# Patient Record
Sex: Female | Born: 1950 | Race: White | Hispanic: No | Marital: Married | Smoking: Never smoker
Health system: Southern US, Community
[De-identification: ages and names within clinical notes are randomized; demographics above are authoritative.]

## PROBLEM LIST (undated history)

## (undated) DIAGNOSIS — C50919 Malignant neoplasm of unspecified site of unspecified female breast: Secondary | ICD-10-CM

## (undated) DIAGNOSIS — R519 Headache, unspecified: Secondary | ICD-10-CM

## (undated) HISTORY — DX: Headache, unspecified: R51.9

## (undated) HISTORY — PX: BREAST LUMPECTOMY: SHX2

## (undated) HISTORY — DX: Malignant neoplasm of unspecified site of unspecified female breast: C50.919

## (undated) HISTORY — PX: MASTECTOMY: SHX3

---

## 1996-02-15 DIAGNOSIS — C50919 Malignant neoplasm of unspecified site of unspecified female breast: Secondary | ICD-10-CM

## 1996-02-15 HISTORY — DX: Malignant neoplasm of unspecified site of unspecified female breast: C50.919

## 2018-03-19 ENCOUNTER — Ambulatory Visit (INDEPENDENT_AMBULATORY_CARE_PROVIDER_SITE_OTHER): Payer: Medicare Other | Admitting: Pulmonary Disease

## 2018-03-19 ENCOUNTER — Encounter: Payer: Self-pay | Admitting: Pulmonary Disease

## 2018-03-19 DIAGNOSIS — R0602 Shortness of breath: Secondary | ICD-10-CM | POA: Diagnosis not present

## 2018-03-19 MED ORDER — PREDNISONE 10 MG PO TABS: 10.0000 mg | ORAL_TABLET | Freq: Two times a day (BID) | ORAL | 0 refills | Status: DC

## 2018-03-19 NOTE — Progress Notes (Signed)
Sonia Bell    [id number]    02-02-1951  Primary Care Physician:Beal, Earma Reading  Referring Physician: No referring provider defined for this encounter.  Chief complaint: Patient being seen for shortness of breath  HPI:  Past history of breast cancer for which she had chemoradiation She did develop pulmonary complications following chemoradiation requiring treatment This was in the 90s She recently has been complaining of some shortness of breath She has not felt acutely ill  She remembers from the last treatment that she had to have multiple PFTs and radiological studies and at the end of her treatment she was told that her lungs did clear up  Has noticed increased shortness of breath for the last couple of weeks There was no associated acute illness  Outpatient Encounter Medications as of 03/19/2018  Medication Sig  . calcium citrate (CALCITRATE - DOSED IN MG ELEMENTAL CALCIUM) 950 MG tablet Take by mouth.  . Coenzyme Q-10 100 MG capsule Take by mouth.  . levothyroxine (SYNTHROID) 75 MCG tablet TAKE 1 TABLET DAILY  . magnesium oxide (MAG-OX) 400 MG tablet Take by mouth.  . Omega-3 Fatty Acids (FISH OIL) 1000 MG CAPS Take by mouth.  . SUMAtriptan (IMITREX) 100 MG tablet TAKE 1 TABLET AT ONSET OF HEADACHE. MAY REPEAT IN 2 HOURS IF NEEDED.  Marland Kitchen predniSONE (DELTASONE) 10 MG tablet Take 1 tablet (10 mg total) by mouth 2 (two) times daily with a meal.   No facility-administered encounter medications on file as of 03/19/2018.     Allergies as of 03/19/2018 - Review Complete 03/19/2018  Allergen Reaction Noted  . Iodides Rash 03/19/2018  . Quinine derivatives  08/31/2011    Past Medical History:  Diagnosis Date  . Breast cancer ([place]) 02/15/1996   right side    No family history on file.  Social History   Socioeconomic History  . Marital status: Married    Spouse name: Not on file  . Number of children: Not on file  . Years of education: Not on file  .  Highest education level: Not on file  Occupational History  . Not on file  Social Needs  . Financial resource strain: Not on file  . Food insecurity:    Worry: Not on file    Inability: Not on file  . Transportation needs:    Medical: Not on file    Non-medical: Not on file  Tobacco Use  . Smoking status: Never Smoker  . Smokeless tobacco: Never Used  Substance and Sexual Activity  . Alcohol use: Not on file  . Drug use: Not on file  . Sexual activity: Not on file  Lifestyle  . Physical activity:    Days per week: Not on file    Minutes per session: Not on file  . Stress: Not on file  Relationships  . Social connections:    Talks on phone: Not on file    Gets together: Not on file    Attends religious service: Not on file    Active member of club or organization: Not on file    Attends meetings of clubs or organizations: Not on file    Relationship status: Not on file  . Intimate partner violence:    Fear of current or ex partner: Not on file    Emotionally abused: Not on file    Physically abused: Not on file    Forced sexual activity: Not on file  Other Topics Concern  .  Not on file  Social History Narrative  . Not on file    Review of Systems  Constitutional: Negative.   HENT: Negative.   Eyes: Negative.   Respiratory: Positive for shortness of breath.   Cardiovascular: Negative.   Gastrointestinal: Negative.   Endocrine: Negative.     Vitals:   03/19/18 1440  BP: 140/88  Pulse: 84  SpO2: 97%     Physical Exam  Constitutional: She appears well-developed and well-nourished.  HENT:  Head: Normocephalic.  Eyes: Pupils are equal, round, and reactive to light. Conjunctivae are normal. Right eye exhibits no discharge. Left eye exhibits no discharge.  Neck: Normal range of motion. Neck supple. No tracheal deviation present. No thyromegaly present.  Cardiovascular: Normal rate and regular rhythm.  Pulmonary/Chest: Effort normal and breath sounds normal.  No respiratory distress. She has no wheezes.  Abdominal: Soft. Bowel sounds are normal. She exhibits no distension. There is no abdominal tenderness.   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 twice daily of prednisone Obtain a CT scan of the chest without contrast Obtain a pulmonary function study  If no significant findings, will consider an echocardiogram to assess cardiac functions  Encouraged to call with any significant concerns   Sherrilyn Rist MD [facility] 03/19/2018, 3:04 PM  CC: No ref. provider found

## 2018-03-19 NOTE — Patient Instructions (Signed)
Shortness of breath History of breast cancer with chemoradiation-associated with pulmonary complications Symptoms likely related to recurrence of inflammation in the lungs  Short course of steroids Obtain a CT scan of the chest Obtain a breathing study  We will see you back in the office in about 4 weeks

## 2018-04-05 ENCOUNTER — Ambulatory Visit (INDEPENDENT_AMBULATORY_CARE_PROVIDER_SITE_OTHER)
Admission: RE | Admit: 2018-04-05 | Discharge: 2018-04-05 | Disposition: A | Discharge status: Home / Self Care | Payer: Medicare Other | Source: Ambulatory Visit | Attending: Pulmonary Disease | Admitting: Pulmonary Disease

## 2018-04-05 ENCOUNTER — Other Ambulatory Visit: Payer: Self-pay | Admitting: Pulmonary Disease

## 2018-04-05 ENCOUNTER — Telehealth: Payer: Self-pay | Admitting: Pulmonary Disease

## 2018-04-05 DIAGNOSIS — R0602 Shortness of breath: Secondary | ICD-10-CM | POA: Diagnosis not present

## 2018-04-05 MED ORDER — PREDNISONE 10 MG PO TABS: 10.0000 mg | ORAL_TABLET | Freq: Every day | ORAL | 0 refills | Status: DC

## 2018-04-05 NOTE — Telephone Encounter (Signed)
LVMTCB x 1 for patient. Need to confirm if the patient still has prednisone or needs refill issued.

## 2018-04-05 NOTE — Telephone Encounter (Signed)
Place her back on prednisone and wean slowly

## 2018-04-05 NOTE — Telephone Encounter (Signed)
Call made to patient, per patient Patient states as she came off of prednisone she has had severe fatigue with bodyaches,joint pain,nausea and vomiting,loss of appetite,bloody nose. Stopped prednisone on 01/24 and symptoms began 01/25. She declined appt stating she would come if a provider told her she had too.   OA please advise. Thanks.

## 2018-04-08 NOTE — Telephone Encounter (Signed)
Pt is aware of below message/recommendations and voiced her understanding. °Nothing further is needed.  °

## 2018-04-08 NOTE — Telephone Encounter (Signed)
Spoke to pt andf relayed below recommendations.  Pt states symptoms have improved but not completely subsided.  Pt reports of continuous weakness. Pt is questioning if she should resume the prednsione due to symptoms improving.   OA please advise. Thanks

## 2018-04-08 NOTE — Telephone Encounter (Signed)
If symptoms are improving, she does not have to restart prednisone Symptoms will take time to resolve   Withdrawal from prednisone can be associated with weakness

## 2018-04-16 ENCOUNTER — Encounter: Payer: Self-pay | Admitting: Pulmonary Disease

## 2018-04-16 ENCOUNTER — Ambulatory Visit (INDEPENDENT_AMBULATORY_CARE_PROVIDER_SITE_OTHER): Payer: Medicare Other | Admitting: Pulmonary Disease

## 2018-04-16 ENCOUNTER — Telehealth: Payer: Self-pay | Admitting: Pulmonary Disease

## 2018-04-16 DIAGNOSIS — R911 Solitary pulmonary nodule: Secondary | ICD-10-CM | POA: Diagnosis not present

## 2018-04-16 DIAGNOSIS — R0602 Shortness of breath: Secondary | ICD-10-CM | POA: Diagnosis not present

## 2018-04-16 LAB — CBC WITH DIFFERENTIAL/PLATELET
Basophils Absolute: 0 10*3/uL (ref 0.0–0.1)
Basophils Relative: 0.7 % (ref 0.0–3.0)
Eosinophils Absolute: 0.1 10*3/uL (ref 0.0–0.7)
Eosinophils Relative: 0.9 % (ref 0.0–5.0)
HCT: 40.2 % (ref 36.0–46.0)
Hemoglobin: 13.5 g/dL (ref 12.0–15.0)
Lymphocytes Relative: 20.3 % (ref 12.0–46.0)
Lymphs Abs: 1.2 10*3/uL (ref 0.7–4.0)
MCHC: 33.7 g/dL (ref 30.0–36.0)
MCV: 93.9 fl (ref 78.0–100.0)
MONOS PCT: 5.7 % (ref 3.0–12.0)
Monocytes Absolute: 0.3 10*3/uL (ref 0.1–1.0)
Neutro Abs: 4.4 10*3/uL (ref 1.4–7.7)
Neutrophils Relative %: 72.4 % (ref 43.0–77.0)
Platelets: 363 10*3/uL (ref 150.0–400.0)
RBC: 4.28 Mil/uL (ref 3.87–5.11)
RDW: 14 % (ref 11.5–15.5)
WBC: 6.1 10*3/uL (ref 4.0–10.5)

## 2018-04-16 LAB — PULMONARY FUNCTION TEST
DL/VA % pred: 75 %
DL/VA: 3.06 ml/min/mmHg/L
DLCO unc % pred: 67 %
DLCO unc: 14.82 ml/min/mmHg
FEF 25-75 Post: 2.63 L/sec
FEF 25-75 Pre: 1.99 L/sec
FEF2575-%Change-Post: 32 %
FEF2575-%PRED-POST: 116 %
FEF2575-%Pred-Pre: 88 %
FEV1-%CHANGE-POST: 6 %
FEV1-%Pred-Post: 91 %
FEV1-%Pred-Pre: 85 %
FEV1-Post: 2.47 L
FEV1-Pre: 2.31 L
FEV1FVC-%Change-Post: 7 %
FEV1FVC-%Pred-Pre: 101 %
FEV6-%Change-Post: 0 %
FEV6-%Pred-Post: 86 %
FEV6-%Pred-Pre: 86 %
FEV6-Post: 2.94 L
FEV6-Pre: 2.94 L
FEV6FVC-%Change-Post: 0 %
FEV6FVC-%Pred-Post: 104 %
FEV6FVC-%Pred-Pre: 103 %
FVC-%Change-Post: 0 %
FVC-%PRED-POST: 83 %
FVC-%Pred-Pre: 83 %
FVC-Post: 2.94 L
FVC-Pre: 2.96 L
POST FEV1/FVC RATIO: 84 %
PRE FEV6/FVC RATIO: 99 %
Post FEV6/FVC ratio: 100 %
Pre FEV1/FVC ratio: 78 %
RV % pred: 97 %
RV: 2.24 L
TLC % pred: 95 %
TLC: 5.32 L

## 2018-04-16 LAB — BASIC METABOLIC PANEL
BUN: 23 mg/dL (ref 6–23)
CO2: 30 mEq/L (ref 19–32)
Calcium: 9.5 mg/dL (ref 8.4–10.5)
Chloride: 100 mEq/L (ref 96–112)
Creatinine, Ser: 0.93 mg/dL (ref 0.40–1.20)
GFR: 60.06 mL/min (ref >60.00)
GLUCOSE: 99 mg/dL (ref 70–99)
Potassium: 3.8 mEq/L (ref 3.5–5.1)
SODIUM: 139 meq/L (ref 135–145)

## 2018-04-16 LAB — SEDIMENTATION RATE: Sed Rate: 10 mm/hr (ref 0–30)

## 2018-04-16 MED ORDER — ALBUTEROL SULFATE HFA 108 (90 BASE) MCG/ACT IN AERS: 2.0000 | INHALATION_SPRAY | Freq: Four times a day (QID) | RESPIRATORY_TRACT | 6 refills | Status: DC | PRN

## 2018-04-16 NOTE — Patient Instructions (Signed)
Repeat CT scan of the chest in 3 months to follow-up on the lung nodule on the right lung  Prescription for albuterol to be used 2 puffs up to 4 times daily as needed  Obtain CBC, BMP, ESR  I will see you back in 3 months-appointment should be after you have had the CT 

## 2018-04-16 NOTE — Progress Notes (Signed)
PFT done today. 

## 2018-04-16 NOTE — Addendum Note (Signed)
Addended by: Suzzanne Cloud E on: 04/16/2018 10:12 AM   Modules accepted: Orders

## 2018-04-16 NOTE — Telephone Encounter (Signed)
Laurin Coder, MD  Madolyn Frieze, LPN        Call patient   8 mm lung nodule  Requires follow-up in 3 months to assess stability  No other significant abnormality noted on CT   Unclear whether it has been there for a while or its new--we will follow-up to make sure it is stable and if it shows any signs of instability then may need other intervention    Called and spoke with pt letting her know the results of CT and stated to her that we will repeat CT in 3 months. Pt expressed understanding. Order has been placed for CT to be done in 3 months.nothing further needed.

## 2018-04-16 NOTE — Progress Notes (Signed)
Sonia Bell    [id number]    Jun 29, 1950  Primary Care Physician:Beal, Earma Reading  Referring Physician: Nicholes Rough, PA-C [address] [place], [place] [address]  Chief complaint: Patient being seen for shortness of breath Fatigue since completing course of prednisone  HPI: She does have fatigue Denies any acute illness  She is not coughing She does get short of breath with activity  She completed course of low-dose prednisone  Past history of breast cancer for which she had chemoradiation She did develop pulmonary complications following chemoradiation requiring treatment This was in the 90s She recently has been complaining of some shortness of breath  She remembers from the last treatment that she had to have multiple PFTs and radiological studies and at the end of her treatment she was told that her lungs did clear up  Has noticed increased shortness of breath for the last couple of weeks There was no associated acute illness  Outpatient Encounter Medications as of 04/16/2018  Medication Sig  . calcium citrate (CALCITRATE - DOSED IN MG ELEMENTAL CALCIUM) 950 MG tablet Take by mouth.  . Coenzyme Q-10 100 MG capsule Take by mouth.  . levothyroxine (SYNTHROID) 75 MCG tablet TAKE 1 TABLET DAILY  . magnesium oxide (MAG-OX) 400 MG tablet Take by mouth.  . Omega-3 Fatty Acids (FISH OIL) 1000 MG CAPS Take by mouth.  . predniSONE (DELTASONE) 10 MG tablet Take 1 tablet (10 mg total) by mouth 2 (two) times daily with a meal.  . predniSONE (DELTASONE) 10 MG tablet Take 1 tablet (10 mg total) by mouth daily with breakfast. 10 mg daily for 5 days then 5 mg daily for 5 days then 5 mg on alternate days for 5 doses  . SUMAtriptan (IMITREX) 100 MG tablet TAKE 1 TABLET AT ONSET OF HEADACHE. MAY REPEAT IN 2 HOURS IF NEEDED.  Marland Kitchen albuterol (PROVENTIL HFA;VENTOLIN HFA) 108 (90 Base) MCG/ACT inhaler Inhale 2 puffs into the lungs every 6 (six) hours as needed for wheezing or  shortness of breath.   No facility-administered encounter medications on file as of 04/16/2018.     Allergies as of 04/16/2018 - Review Complete 04/16/2018  Allergen Reaction Noted  . Iodides Nausea And Vomiting 03/19/2018  . Quinine derivatives  08/31/2011    Past Medical History:  Diagnosis Date  . Breast cancer ([place]) 02/15/1996   right side    No family history on file.  Social History   Socioeconomic History  . Marital status: Married    Spouse name: Not on file  . Number of children: Not on file  . Years of education: Not on file  . Highest education level: Not on file  Occupational History  . Not on file  Social Needs  . Financial resource strain: Not on file  . Food insecurity:    Worry: Not on file    Inability: Not on file  . Transportation needs:    Medical: Not on file    Non-medical: Not on file  Tobacco Use  . Smoking status: Never Smoker  . Smokeless tobacco: Never Used  Substance and Sexual Activity  . Alcohol use: Not on file  . Drug use: Not on file  . Sexual activity: Not on file  Lifestyle  . Physical activity:    Days per week: Not on file    Minutes per session: Not on file  . Stress: Not on file  Relationships  . Social connections:  Talks on phone: Not on file    Gets together: Not on file    Attends religious service: Not on file    Active member of club or organization: Not on file    Attends meetings of clubs or organizations: Not on file    Relationship status: Not on file  . Intimate partner violence:    Fear of current or ex partner: Not on file    Emotionally abused: Not on file    Physically abused: Not on file    Forced sexual activity: Not on file  Other Topics Concern  . Not on file  Social History Narrative  . Not on file    Review of Systems  Constitutional: Positive for fatigue.  HENT: Negative.   Eyes: Negative.   Respiratory: Positive for shortness of breath.   Cardiovascular: Negative.     Gastrointestinal: Negative.   Endocrine: Negative.     Vitals:   04/16/18 0950  BP: 132/80  Pulse: (!) 102  SpO2: 98%     Physical Exam  Constitutional: She appears well-developed and well-nourished.  HENT:  Head: Normocephalic.  Eyes: Pupils are equal, round, and reactive to light. Conjunctivae are normal. Right eye exhibits no discharge. Left eye exhibits no discharge.  Neck: Normal range of motion. Neck supple. No tracheal deviation present. No thyromegaly present.  Cardiovascular: Normal rate and regular rhythm.  Pulmonary/Chest: Effort normal and breath sounds normal. No respiratory distress. She has no wheezes. She has no rales. She exhibits no tenderness.  Abdominal: Soft. Bowel sounds are normal. She exhibits no distension. There is no abdominal tenderness.   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

## 2018-07-09 ENCOUNTER — Telehealth: Payer: Self-pay | Admitting: *Deleted

## 2018-07-09 NOTE — Telephone Encounter (Signed)
Covid-19 travel screening questions  Have you traveled in the last 14 days? If yes where? No Do you now or have you had a fever in the last 14 days? No Do you have any respiratory symptoms of shortness of breath or cough now or in the last 14 days? No Do you have any family members or close contacts with diagnosed or suspected Covid-19? No      

## 2018-07-10 ENCOUNTER — Other Ambulatory Visit: Payer: Self-pay

## 2018-07-10 ENCOUNTER — Ambulatory Visit (INDEPENDENT_AMBULATORY_CARE_PROVIDER_SITE_OTHER)
Admission: RE | Admit: 2018-07-10 | Discharge: 2018-07-10 | Disposition: A | Discharge status: Home / Self Care | Payer: Medicare Other | Source: Ambulatory Visit | Attending: Pulmonary Disease | Admitting: Pulmonary Disease

## 2018-07-10 DIAGNOSIS — R911 Solitary pulmonary nodule: Secondary | ICD-10-CM

## 2018-07-17 ENCOUNTER — Ambulatory Visit: Payer: Medicare Other | Admitting: Pulmonary Disease

## 2018-07-17 ENCOUNTER — Encounter: Payer: Self-pay | Admitting: Adult Health

## 2018-07-17 ENCOUNTER — Ambulatory Visit (INDEPENDENT_AMBULATORY_CARE_PROVIDER_SITE_OTHER): Payer: Medicare Other | Admitting: Adult Health

## 2018-07-17 ENCOUNTER — Other Ambulatory Visit: Payer: Self-pay

## 2018-07-17 DIAGNOSIS — J45909 Unspecified asthma, uncomplicated: Secondary | ICD-10-CM | POA: Insufficient documentation

## 2018-07-17 DIAGNOSIS — R911 Solitary pulmonary nodule: Secondary | ICD-10-CM | POA: Diagnosis not present

## 2018-07-17 DIAGNOSIS — J4520 Mild intermittent asthma, uncomplicated: Secondary | ICD-10-CM | POA: Diagnosis not present

## 2018-07-17 DIAGNOSIS — R918 Other nonspecific abnormal finding of lung field: Secondary | ICD-10-CM | POA: Diagnosis not present

## 2018-07-17 NOTE — Assessment & Plan Note (Signed)
Mild intermittent asthma improved symptom control with with as needed albuterol.  Plan  Patient Instructions  Plan on CT chest without contrast in 9 months to follow lung nodule ( 1mm) .  Albuterol inhaler As needed shortness of breath or wheezing  Follow up with Dr. Ander Slade in 9 months after CT chest and As needed

## 2018-07-17 NOTE — Progress Notes (Signed)
@Patient  ID: Sonia Bell, female    DOB: 1950/11/16, 68 y.o.   MRN: [id number]  Chief Complaint  Patient presents with   Follow-up    Lung nodule    Referring provider: Randa Evens  HPI: 68 year old female never smoker seen for pulmonary consult January 2020 for shortness of breath and lung nodule  TEST/EVENTS :  PFT reviewed showing hyperactivity in the small airways No major obstruction  CT scan of the chest shows a nodule in the right upper lobe which was not described on CT from 2013 -Reviewed with the patient  07/17/2018 Follow up : Lung nodule and dyspnea Patient returns for a 86-month follow-up.  Patient was seen earlier this year for shortness of breath and lung nodule.  Patient has a history of breast cancer status post mastectomy and radiation therapy.  A CT chest was done on April 05, 2018 that showed 8 mm semisolid nodular opacity in the right upper lobe.  Calcified granuloma in the right upper lobe, biapical scarring right greater than left.  Patient had a repeat CT chest on Jul 10, 2018 that showed a stable 0.7 cm pulmonary nodule in the right upper lobe.  Nodule appeared thin and linear consistent with a benign process.  Scattered calcified pulmonary nodules bilaterally.  Tiny 3 mm pulmonary nodule in the left lower lobe.  No mediastinal adenopathy. We reviewed her test results in detail.  We discussed a repeat CT chest in 9 months.  Patient had had initial shortness of breath . PFT showed no airflow obstruction or restriction.  Positive reversibility.. She was started on Albuterol . Says breathing is doing much better.  Says that she is very active.  She uses her albuterol on occasion.  Does not use it on an every day basis.  Says that she has no shortness of breath, no wheezing or cough.   Allergies  Allergen Reactions   Quinine Derivatives     Immunization History  Administered Date(s) Administered   Influenza Split 03/16/2011   Influenza Whole  10/17/2017   Influenza, Seasonal, Injecte, Preservative Fre 12/12/2016   Pneumococcal Conjugate-13 09/28/2017   Tdap 04/03/2006    Past Medical History:  Diagnosis Date   Breast cancer ([place]) 02/15/1996   right side    Tobacco History: Social History   Tobacco Use  Smoking Status Never Smoker  Smokeless Tobacco Never Used   Counseling given: Not Answered   Outpatient Medications Prior to Visit  Medication Sig Dispense Refill   albuterol (PROVENTIL HFA;VENTOLIN HFA) 108 (90 Base) MCG/ACT inhaler Inhale 2 puffs into the lungs every 6 (six) hours as needed for wheezing or shortness of breath. 1 Inhaler 6   calcium citrate (CALCITRATE - DOSED IN MG ELEMENTAL CALCIUM) 950 MG tablet Take by mouth.     Coenzyme Q-10 100 MG capsule Take by mouth.     levothyroxine (SYNTHROID) 75 MCG tablet TAKE 1 TABLET DAILY     magnesium oxide (MAG-OX) 400 MG tablet Take by mouth.     Omega-3 Fatty Acids (FISH OIL) 1000 MG CAPS Take by mouth.     SUMAtriptan (IMITREX) 100 MG tablet TAKE 1 TABLET AT ONSET OF HEADACHE. MAY REPEAT IN 2 HOURS IF NEEDED.     predniSONE (DELTASONE) 10 MG tablet Take 1 tablet (10 mg total) by mouth 2 (two) times daily with a meal. 20 tablet 0   predniSONE (DELTASONE) 10 MG tablet Take 1 tablet (10 mg total) by mouth daily with breakfast. 10 mg daily for  5 days then 5 mg daily for 5 days then 5 mg on alternate days for 5 doses 30 tablet 0   No facility-administered medications prior to visit.      Review of Systems:   Constitutional:   No  weight loss, night sweats,  Fevers, chills, fatigue, or  lassitude.  HEENT:   No headaches,  Difficulty swallowing,  Tooth/dental problems, or  Sore throat,                No sneezing, itching, ear ache, nasal congestion, post nasal drip,   CV:  No chest pain,  Orthopnea, PND, swelling in lower extremities, anasarca, dizziness, palpitations, syncope.   GI  No heartburn, indigestion, abdominal pain, nausea, vomiting,  diarrhea, change in bowel habits, loss of appetite, bloody stools.   Resp: No shortness of breath with exertion or at rest.  No excess mucus, no productive cough,  No non-productive cough,  No coughing up of blood.  No change in color of mucus.  No wheezing.  No chest wall deformity  Skin: no rash or lesions.  GU: no dysuria, change in color of urine, no urgency or frequency.  No flank pain, no hematuria   MS:  No joint pain or swelling.  No decreased range of motion.  No back pain.    Physical Exam  BP 118/80 (BP Location: Left Arm, Cuff Size: Normal)    Pulse 91    Temp 98.2 F (36.8 C)    Ht 5' 6.5" (1.689 m)    Wt 137 lb 6.4 oz (62.3 kg)    SpO2 100%    BMI 21.84 kg/m   GEN: A/Ox3; pleasant , NAD, well nourished    HEENT:  [place]/AT,  NOSE-clear, THROAT-clear, no lesions, no postnasal drip or exudate noted.   NECK:  Supple w/ fair ROM; no JVD; normal carotid impulses w/o bruits; no thyromegaly or nodules palpated; no lymphadenopathy.    RESP  Clear  P & A; w/o, wheezes/ rales/ or rhonchi. no accessory muscle use, no dullness to percussion  CARD:  RRR, no m/r/g, no peripheral edema, pulses intact, no cyanosis or clubbing.  GI:   Soft & nt; nml bowel sounds; no organomegaly or masses detected.   Musco: Warm bil, no deformities or joint swelling noted.   Neuro: alert, no focal deficits noted.    Skin: Warm, no lesions or rashes    Lab Results:  CBC    Component Value Date/Time   WBC 6.1 04/16/2018 1013   RBC 4.28 04/16/2018 1013   HGB 13.5 04/16/2018 1013   HCT 40.2 04/16/2018 1013   PLT 363.0 04/16/2018 1013   MCV 93.9 04/16/2018 1013   MCHC 33.7 04/16/2018 1013   RDW 14.0 04/16/2018 1013   LYMPHSABS 1.2 04/16/2018 1013   MONOABS 0.3 04/16/2018 1013   EOSABS 0.1 04/16/2018 1013   BASOSABS 0.0 04/16/2018 1013    BMET    Component Value Date/Time   NA 139 04/16/2018 1013   K 3.8 04/16/2018 1013   CL 100 04/16/2018 1013   CO2 30 04/16/2018 1013   GLUCOSE 99  04/16/2018 1013   BUN 23 04/16/2018 1013   CREATININE 0.93 04/16/2018 1013   CALCIUM 9.5 04/16/2018 1013    BNP No results found for: BNP  ProBNP No results found for: PROBNP  Imaging: Ct Chest Wo Contrast  Result Date: 07/10/2018 CLINICAL DATA:  Lung nodule greater than 1 cm. EXAM: CT CHEST WITHOUT CONTRAST TECHNIQUE: Multidetector CT imaging of the  chest was performed following the standard protocol without IV contrast. COMPARISON:  CT dated 04/05/2018 FINDINGS: Cardiovascular: No significant vascular findings. Normal heart size. No pericardial effusion. Mild atherosclerotic changes are noted of the thoracic aorta Mediastinum/Nodes: No enlarged mediastinal or axillary lymph nodes. Thyroid gland, trachea, and esophagus demonstrate no significant findings. Lungs/Pleura: There is a 0.7 cm pulmonary nodule in the medial right upper lobe axial series 3, image 36). This nodule is thin and linear on the coronal series. There is an area of pleuroparenchymal scarring at the right lung apex measuring approximately 2.8 by 1.2 cm. There are scattered calcified pulmonary nodules bilaterally. There is a tiny 3 mm pulmonary nodule at the left lower lobe (axial series 3, image 105). There is no pneumothorax. No large pleural effusion. There is some mild scarring at the right lower lobe. Upper Abdomen: No acute abnormality. Musculoskeletal: The patient is status post right-sided mastectomy with placement of a right-sided breast implant. IMPRESSION: 1. Stable 0.7 cm pulmonary nodule in the right upper lobe. This nodule appears thin and linear on the coronal series and is favored to represent a benign process. However, continued follow-up is recommended. Non-contrast chest CT at 6-12 months is recommended. If the nodule is stable at time of repeat CT, then future CT at 18-24 months (from today's scan) is considered optional for low-risk patients, but is recommended for high-risk patients. This recommendation follows  the consensus statement: Guidelines for Management of Incidental Pulmonary Nodules Detected on CT Images: From the Fleischner Society 2017; Radiology 2017; 284:228-243. 2. No acute intrathoracic abnormality detected. Aortic Atherosclerosis (ICD10-I70.0). Electronically Signed   By: Constance Holster M.D.   On: 07/10/2018 14:30      PFT Results Latest Ref Rng & Units 04/16/2018  FVC-Pre L 2.96  FVC-Predicted Pre % 83  FVC-Post L 2.94  FVC-Predicted Post % 83  Pre FEV1/FVC % % 78  Post FEV1/FCV % % 84  FEV1-Pre L 2.31  FEV1-Predicted Pre % 85  FEV1-Post L 2.47  DLCO UNC% % 67  DLCO COR %Predicted % 75  TLC L 5.32  TLC % Predicted % 95  RV % Predicted % 97    No results found for: NITRICOXIDE      Assessment & Plan:   Lung nodule Stable right upper lobe lung nodule.  Will set up follow-up CT chest without contrast in 9 months to follow this serially.  Asthma Mild intermittent asthma improved symptom control with with as needed albuterol.  Plan  Patient Instructions  Plan on CT chest without contrast in 9 months to follow lung nodule ( 42mm) .  Albuterol inhaler As needed shortness of breath or wheezing  Follow up with Dr. Ander Slade in 9 months after CT chest and As needed           Rexene Edison, NP 07/17/2018

## 2018-07-17 NOTE — Assessment & Plan Note (Signed)
Stable right upper lobe lung nodule.  Will set up follow-up CT chest without contrast in 9 months to follow this serially.

## 2018-07-17 NOTE — Patient Instructions (Addendum)
Plan on CT chest without contrast in 9 months to follow lung nodule ( 35mm) .  Albuterol inhaler As needed shortness of breath or wheezing  Follow up with Dr. Ander Slade in 9 months after CT chest and As needed

## 2019-03-24 NOTE — Telephone Encounter (Signed)
mychart message sent by pt verifying when her next appt was. I called pt and have scheduled her a f/u with Dr. Jenetta Downer 2/17 at 9am. In last OV with TP, she stated for pt to have a f/u 9 months after CT scan. Pt does not have a CT scheduled yet.  Stated to pt that I would send this to [facility] so they can get the CT appt scheduled based on when pt's OV is and she verbalized understanding. Routing to [facility].

## 2019-04-21 ENCOUNTER — Ambulatory Visit
Admission: RE | Admit: 2019-04-21 | Discharge: 2019-04-21 | Disposition: A | Discharge status: Home / Self Care | Payer: Medicare Other | Source: Ambulatory Visit | Attending: Adult Health | Admitting: Adult Health

## 2019-04-21 DIAGNOSIS — R918 Other nonspecific abnormal finding of lung field: Secondary | ICD-10-CM

## 2019-04-23 ENCOUNTER — Telehealth: Payer: Self-pay

## 2019-04-23 ENCOUNTER — Ambulatory Visit: Payer: Medicare Other | Admitting: Pulmonary Disease

## 2019-04-23 DIAGNOSIS — R911 Solitary pulmonary nodule: Secondary | ICD-10-CM

## 2019-04-23 NOTE — Telephone Encounter (Signed)
-----   Message from Laurin Coder, MD sent at 04/23/2019  9:33 AM EST ----- CT scan reviewed showing stable lung nodules  Recommendation is to follow-up in 12 months  Schedule CT scan without contrast for 12 months

## 2019-04-23 NOTE — Telephone Encounter (Signed)
I called and spoke with the patient and made her aware of her results. She verbalized understanding. I have placed an order for CT in  A year.

## 2019-04-28 ENCOUNTER — Encounter: Payer: Self-pay | Admitting: Pulmonary Disease

## 2019-04-28 ENCOUNTER — Ambulatory Visit (INDEPENDENT_AMBULATORY_CARE_PROVIDER_SITE_OTHER): Payer: Medicare Other | Admitting: Pulmonary Disease

## 2019-04-28 ENCOUNTER — Other Ambulatory Visit: Payer: Self-pay

## 2019-04-28 VITALS — BP 152/78 | HR 94 | Temp 97.5°F | Ht 66.5 in | Wt 138.0 lb

## 2019-04-28 DIAGNOSIS — R911 Solitary pulmonary nodule: Secondary | ICD-10-CM | POA: Diagnosis not present

## 2019-04-28 NOTE — Progress Notes (Signed)
Sonia Bell    [id number]    15-Jun-1950  Primary Care Physician:Beal, Earma Reading  Referring Physician: Nicholes Rough, PA-C [address] [place],  [place] [address]  Chief complaint: Patient being seen for shortness of breath Lung nodules  HPI: She still has some shortness of breath Albuterol helps  Albuterol does make her tired  CT scan recently showed stable lung nodules 8 mm and 3 mm  She is not coughing She does get short of breath with activity  Past history of breast cancer for which she had chemoradiation She did develop pulmonary complications following chemoradiation requiring treatment This was in the 90s She recently has been complaining of some shortness of breath  She remembers from the last treatment that she had to have multiple PFTs and radiological studies and at the end of her treatment she was told that her lungs did clear up  Has noticed increased shortness of breath for the last couple of weeks There was no associated acute illness  Outpatient Encounter Medications as of 04/28/2019  Medication Sig  . albuterol (PROVENTIL HFA;VENTOLIN HFA) 108 (90 Base) MCG/ACT inhaler Inhale 2 puffs into the lungs every 6 (six) hours as needed for wheezing or shortness of breath.  . calcium citrate (CALCITRATE - DOSED IN MG ELEMENTAL CALCIUM) 950 MG tablet Take by mouth.  . Coenzyme Q-10 100 MG capsule Take by mouth.  . levothyroxine (SYNTHROID) 75 MCG tablet TAKE 1 TABLET DAILY  . magnesium oxide (MAG-OX) 400 MG tablet Take by mouth.  . Omega-3 Fatty Acids (FISH OIL) 1000 MG CAPS Take by mouth.  . SUMAtriptan (IMITREX) 100 MG tablet TAKE 1 TABLET AT ONSET OF HEADACHE. MAY REPEAT IN 2 HOURS IF NEEDED.   No facility-administered encounter medications on file as of 04/28/2019.    Allergies as of 04/28/2019 - Review Complete 04/28/2019  Allergen Reaction Noted  . Quinine derivatives  08/31/2011    Past Medical History:  Diagnosis Date  . Breast cancer  ([place]) 02/15/1996   right side    History reviewed. No pertinent family history.  Social History   Socioeconomic History  . Marital status: Married    Spouse name: Not on file  . Number of children: Not on file  . Years of education: Not on file  . Highest education level: Not on file  Occupational History  . Not on file  Tobacco Use  . Smoking status: Never Smoker  . Smokeless tobacco: Never Used  Substance and Sexual Activity  . Alcohol use: Not on file  . Drug use: Not on file  . Sexual activity: Not on file  Other Topics Concern  . Not on file  Social History Narrative  . Not on file   Social Determinants of Health   Financial Resource Strain:   . Difficulty of Paying Living Expenses: Not on file  Food Insecurity:   . Worried About Charity fundraiser in the Last Year: Not on file  . Ran Out of Food in the Last Year: Not on file  Transportation Needs:   . Lack of Transportation (Medical): Not on file  . Lack of Transportation (Non-Medical): Not on file  Physical Activity:   . Days of Exercise per Week: Not on file  . Minutes of Exercise per Session: Not on file  Stress:   . Feeling of Stress : Not on file  Social Connections:   . Frequency of Communication with Friends and Family: Not on  file  . Frequency of Social Gatherings with Friends and Family: Not on file  . Attends Religious Services: Not on file  . Active Member of Clubs or Organizations: Not on file  . Attends Archivist Meetings: Not on file  . Marital Status: Not on file  Intimate Partner Violence:   . Fear of Current or Ex-Partner: Not on file  . Emotionally Abused: Not on file  . Physically Abused: Not on file  . Sexually Abused: Not on file    Review of Systems  Constitutional: Positive for fatigue.  HENT: Negative.   Eyes: Negative.   Respiratory: Positive for shortness of breath.   Cardiovascular: Negative.   Gastrointestinal: Negative.   Endocrine: Negative.     Vitals:    04/28/19 1204  BP: (!) 152/78  Pulse: 94  Temp: (!) 97.5 F (36.4 C)  SpO2: 96%   Physical Exam  Constitutional: She appears well-developed and well-nourished.  HENT:  Head: Normocephalic.  Eyes: Pupils are equal, round, and reactive to light. Conjunctivae are normal. Right eye exhibits no discharge. Left eye exhibits no discharge.  Neck: No tracheal deviation present. No thyromegaly present.  Cardiovascular: Normal rate and regular rhythm.  Pulmonary/Chest: Effort normal and breath sounds normal. No respiratory distress. She has no wheezes. She has no rales. She exhibits no tenderness.  Abdominal: Soft. Bowel sounds are normal. She exhibits no distension. There is no abdominal tenderness.  Musculoskeletal:     Cervical back: Normal range of motion and neck supple.   Studies:  PFT reviewed showing hyperactivity in the small airways No major obstruction  CT scan of the chest shows 2 lung nodules, compared with previous and stable  Assessment:  Shortness of breath -Continues to benefit from albuterol -Continue albuterol as needed  Fatigue -May be multifactorial  Abnormal CT showing an 8 mm right upper lobe , 3 mm lung nodule  Plan/Recommendations:  Repeat CT in 1 year  I will see her back in the office in 1 year after her next CAT scan  Encouraged to call with any significant concerns Continue using albuterol as needed     Sherrilyn Rist MD  Pulmonary and Critical Care 04/28/2019, 12:23 PM  CC: Nicholes Rough, PA-C

## 2019-04-28 NOTE — Patient Instructions (Signed)
Lung nodules Stable over the last year  I will see you in about a year from now We will repeat CT in a year  If nodules are stable at the time then we can stop following

## 2019-06-09 MED ORDER — ALBUTEROL SULFATE HFA 108 (90 BASE) MCG/ACT IN AERS: 2.0000 | INHALATION_SPRAY | Freq: Four times a day (QID) | RESPIRATORY_TRACT | 2 refills | Status: DC | PRN

## 2019-06-09 NOTE — Telephone Encounter (Signed)
This matter was addressed already in a previous message, will close message.

## 2020-03-25 ENCOUNTER — Ambulatory Visit (HOSPITAL_COMMUNITY): Payer: Medicare Other

## 2020-04-26 ENCOUNTER — Ambulatory Visit (HOSPITAL_COMMUNITY)
Admission: RE | Admit: 2020-04-26 | Discharge: 2020-04-26 | Disposition: A | Discharge status: Home / Self Care | Payer: Medicare Other | Source: Ambulatory Visit | Attending: Pulmonary Disease | Admitting: Pulmonary Disease

## 2020-04-26 ENCOUNTER — Encounter (HOSPITAL_COMMUNITY): Payer: Self-pay

## 2020-04-26 ENCOUNTER — Other Ambulatory Visit: Payer: Self-pay

## 2020-04-26 DIAGNOSIS — R911 Solitary pulmonary nodule: Secondary | ICD-10-CM | POA: Diagnosis not present

## 2020-04-27 ENCOUNTER — Ambulatory Visit (INDEPENDENT_AMBULATORY_CARE_PROVIDER_SITE_OTHER): Payer: Medicare Other | Admitting: Pulmonary Disease

## 2020-04-27 ENCOUNTER — Encounter: Payer: Self-pay | Admitting: Pulmonary Disease

## 2020-04-27 VITALS — BP 142/86 | HR 91 | Temp 97.7°F | Ht 66.5 in | Wt 136.6 lb

## 2020-04-27 DIAGNOSIS — R911 Solitary pulmonary nodule: Secondary | ICD-10-CM | POA: Diagnosis not present

## 2020-04-27 DIAGNOSIS — R9389 Abnormal findings on diagnostic imaging of other specified body structures: Secondary | ICD-10-CM | POA: Diagnosis not present

## 2020-04-27 NOTE — Patient Instructions (Signed)
Repeat CT scan of the chest in 1 year  Follow-up just after the CT is  Performed  Call with any significant concerns

## 2020-04-27 NOTE — Progress Notes (Signed)
Sonia Bell    [id number]    Feb 06, 1951  Primary Care Physician:Beal, Earma Reading  Referring Physician: Nicholes Rough, PA-C [address] [place],  [place] [address]  Chief complaint: Follow-up for lung nodules, shortness of breath with exertion  HPI: No significant changes in her health  She did have Covid in January 2020 and has not really bounced back since then  Albuterol does help shortness of breath  She is not coughing She does get short of breath with activity  Past history of breast cancer for which she had chemoradiation She did develop pulmonary complications following chemoradiation requiring treatment This was in the 90s She recently has been complaining of some shortness of breath  She remembers from the last treatment that she had to have multiple PFTs and radiological studies and at the end of her treatment she was told that her lungs did clear up  Has noticed increased shortness of breath for the last couple of weeks There was no associated acute illness  Outpatient Encounter Medications as of 04/27/2020  Medication Sig  . albuterol (VENTOLIN HFA) 108 (90 Base) MCG/ACT inhaler Inhale 2 puffs into the lungs every 6 (six) hours as needed for wheezing or shortness of breath.  Marland Kitchen aspirin (ASPIRIN 81) 81 MG EC tablet Take 81 mg by mouth daily. Swallow whole.  . calcium citrate (CALCITRATE - DOSED IN MG ELEMENTAL CALCIUM) 950 MG tablet Take by mouth.  . Coenzyme Q-10 100 MG capsule Take by mouth.  . levothyroxine (SYNTHROID) 75 MCG tablet TAKE 1 TABLET DAILY  . magnesium oxide (MAG-OX) 400 MG tablet Take by mouth.  . Omega-3 Fatty Acids (FISH OIL) 1000 MG CAPS Take by mouth.  . pravastatin (PRAVACHOL) 10 MG tablet Take by mouth. Take 1 tablet daily  . SUMAtriptan (IMITREX) 100 MG tablet TAKE 1 TABLET AT ONSET OF HEADACHE. MAY REPEAT IN 2 HOURS IF NEEDED.   No facility-administered encounter medications on file as of 04/27/2020.    Allergies as of  04/27/2020 - Review Complete 04/27/2020  Allergen Reaction Noted  . Quinine derivatives  08/31/2011    Past Medical History:  Diagnosis Date  . Breast cancer ([place]) 02/15/1996   right side    No family history on file.  Social History   Socioeconomic History  . Marital status: Married    Spouse name: Not on file  . Number of children: Not on file  . Years of education: Not on file  . Highest education level: Not on file  Occupational History  . Not on file  Tobacco Use  . Smoking status: Never Smoker  . Smokeless tobacco: Never Used  Substance and Sexual Activity  . Alcohol use: Not on file  . Drug use: Not on file  . Sexual activity: Not on file  Other Topics Concern  . Not on file  Social History Narrative  . Not on file   Social Determinants of Health   Financial Resource Strain: Not on file  Food Insecurity: Not on file  Transportation Needs: Not on file  Physical Activity: Not on file  Stress: Not on file  Social Connections: Not on file  Intimate Partner Violence: Not on file    Review of Systems  Constitutional: Positive for fatigue.  HENT: Negative.   Eyes: Negative.   Respiratory: Positive for shortness of breath. Negative for cough.   Cardiovascular: Negative.   Gastrointestinal: Negative.   Endocrine: Negative.     Vitals:  04/27/20 0941  BP: (!) 142/86  Pulse: 91  Temp: 97.7 F (36.5 C)  SpO2: 99%   Physical Exam Constitutional:      Appearance: She is well-developed.  HENT:     Head: Normocephalic.  Eyes:     General:        Right eye: No discharge.        Left eye: No discharge.     Conjunctiva/sclera: Conjunctivae normal.     Pupils: Pupils are equal, round, and reactive to light.  Neck:     Thyroid: No thyromegaly.     Trachea: No tracheal deviation.  Cardiovascular:     Rate and Rhythm: Normal rate and regular rhythm.  Pulmonary:     Effort: Pulmonary effort is normal. No respiratory distress.     Breath sounds: Normal  breath sounds. No wheezing or rales.  Chest:     Chest wall: No tenderness.  Abdominal:     Palpations: Abdomen is soft.  Musculoskeletal:     Cervical back: Normal range of motion and neck supple.  Neurological:     Mental Status: She is alert.  Psychiatric:        Mood and Affect: Mood normal.    Studies:  PFT reviewed showing hyperactivity in the small airways No major obstruction  CT scan was reviewed showing stable lung nodules  Assessment:  Shortness of breath -Albuterol as needed  Fatigue -No significant changes  Abnormal CT scan of the chest showing lung nodule -Nodules have been stable the last 3 CTs  Plan/Recommendations:  Repeat CT scan of the chest in a.m. -History of breast cancer  I will see her back in the office in 1 year after her next CAT scan  Encouraged to call with any significant concerns Exercises, resistance training as tolerated     Sherrilyn Rist MD [facility] 04/27/2020, 9:54 AM  CC: Nicholes Rough, PA-C

## 2020-04-27 NOTE — Addendum Note (Signed)
Addended by: Mathis Bud on: 04/27/2020 10:14 AM   Modules accepted: Orders

## 2020-09-07 ENCOUNTER — Other Ambulatory Visit: Payer: Self-pay | Admitting: Pulmonary Disease

## 2021-04-18 ENCOUNTER — Other Ambulatory Visit: Payer: Self-pay

## 2021-04-18 ENCOUNTER — Encounter (HOSPITAL_COMMUNITY): Payer: Self-pay

## 2021-04-18 ENCOUNTER — Ambulatory Visit (HOSPITAL_COMMUNITY)
Admission: RE | Admit: 2021-04-18 | Discharge: 2021-04-18 | Disposition: A | Discharge status: Home / Self Care | Payer: Medicare Other | Source: Ambulatory Visit | Attending: Pulmonary Disease | Admitting: Pulmonary Disease

## 2021-04-18 DIAGNOSIS — R9389 Abnormal findings on diagnostic imaging of other specified body structures: Secondary | ICD-10-CM | POA: Insufficient documentation

## 2021-04-18 DIAGNOSIS — R911 Solitary pulmonary nodule: Secondary | ICD-10-CM | POA: Diagnosis present

## 2021-12-19 ENCOUNTER — Other Ambulatory Visit: Payer: Self-pay | Admitting: Pulmonary Disease

## 2022-02-14 NOTE — Progress Notes (Unsigned)
Referring:  Preston Fleeting, [place] [address],  [place] [address]  PCP: Nicholes Rough, PA-C  Neurology was asked to evaluate Sonia Bell, a 71 year old female for a chief complaint of headaches.  Our recommendations of care will be communicated by shared medical record.    CC:  headaches  History provided from self  HPI:  Medical co-morbidities: breast cancer (s/p mastectomy 1997 and chemo/radiation 1998), hypothyroidism  The patient presents for evaluation of headaches which began when she was 71 years old. Headaches are associated with photophobia, phonophobia, and nausea. She will also rarely have a visual aura of prisms in her vision. Headaches can last for several hours at a time.   She takes Imitrex as needed for rescue which is typically effective and does not cause side effects. She is on Botox for migraine prevention which has kept her headaches well-controlled. She currently has 4 migraines per month with Botox. Last session was in August 2023.  Headache History: Onset: 71 years old Aura: rarely prisms in vision Associated Symptoms:  Photophobia: yes  Phonophobia: yes  Nausea: yes Worse with activity?: yes Duration of headaches: several hours   Migraine days per month: 4 Headache free days per month: 26  Current Treatment: Abortive Imitrex 100 mg PRN  Preventative Botox  Prior Therapies                                 Preventive: Magnesium 400 mg daily CoQ10 100 mg daily Atenolol 50 mg daily - lack of efficacy Topamax - weight loss, cognitive side effects Nortriptyline - nausea, shortness of breath doxepin Botox  Resue: Imitrex 100 mg PRN Zofran 4 mg PRN  LABS: 07/22/21: CBC, CMP unremarkable  IMAGING:  Reportedly had a normal brain MRI ~20 years ago   Current Outpatient Medications on File Prior to Visit  Medication Sig Dispense Refill   albuterol (VENTOLIN HFA) 108 (90 Base) MCG/ACT inhaler INHALE 2 PUFFS INTO THE  LUNGS EVERY 6 HOURS AS NEEDED FOR WHEEZING OR SHORTNESS OF BREATH 6.7 g 6   aspirin (ASPIRIN 81) 81 MG EC tablet Take 81 mg by mouth daily. Swallow whole.     bisoprolol (ZEBETA) 5 MG tablet Take 2.5 mg by mouth daily.     calcium citrate (CALCITRATE - DOSED IN MG ELEMENTAL CALCIUM) 950 MG tablet Take by mouth.     Coenzyme Q-10 100 MG capsule Take by mouth.     levothyroxine (SYNTHROID) 75 MCG tablet TAKE 1 TABLET DAILY     magnesium oxide (MAG-OX) 400 MG tablet Take by mouth.     Omega-3 Fatty Acids (FISH OIL) 1000 MG CAPS Take by mouth.     pravastatin (PRAVACHOL) 10 MG tablet Take by mouth. Take 1 tablet daily     Multiple Vitamins-Minerals (CENTRUM SILVER) tablet Take 1 tablet by mouth daily.     No current facility-administered medications on file prior to visit.     Allergies: Allergies  Allergen Reactions   Quinine Derivatives     Family History: Migraine or other headaches in the family:  cousin has migraines Aneurysms in a first degree relative:  no Brain tumors in the family:  no Other neurological illness in the family:   no  Past Medical History: Past Medical History:  Diagnosis Date   Breast cancer ([place]) 02/15/1996   right side   Headache     Past Surgical History  Past Surgical History:  Procedure Laterality Date   BREAST LUMPECTOMY     MASTECTOMY      Social History: Social History   Tobacco Use   Smoking status: Never   Smokeless tobacco: Never  Substance Use Topics   Alcohol use: Never   Drug use: Never    ROS: Negative for fevers, chills. Positive for headaches. All other systems reviewed and negative unless stated otherwise in HPI.   Physical Exam:   Vital Signs: BP (!) 144/73   Pulse 70   Ht '5\' 6"'$  (1.676 m)   Wt 136 lb (61.7 kg)   BMI 21.95 kg/m  GENERAL: well appearing,in no acute distress,alert SKIN:  Color, texture, turgor normal. No rashes or lesions HEAD:  Normocephalic/atraumatic. CV:  RRR RESP: Normal respiratory  effort MSK: no tenderness to palpation over occiput, neck, or shoulders  NEUROLOGICAL: Mental Status: Alert, oriented to person, place and time,Follows commands Cranial Nerves: PERRL, visual fields intact to confrontation, extraocular movements intact, facial sensation intact, no facial droop or ptosis, hearing grossly intact, no dysarthria Motor: muscle strength 5/5 both upper and lower extremities Reflexes: 2+ throughout Sensation: intact to light touch all 4 extremities Coordination: Finger-to- nose-finger intact bilaterally Gait: normal-based   IMPRESSION: 71 year old female who presents for evaluation of migraine headaches. She is happy with her current migraine control on Botox for prevention and Imitrex for rescue. Will get her set up for Botox in our office and continue Imitrex PRN.  PLAN: -Prevention: Continue Botox every 3 months -Rescue: Continue Imitrex 100 mg PRN   I spent a total of 19 minutes chart reviewing and counseling the patient. Headache education was done. Discussed treatment options including preventive and acute medications. Discussed medication side effects, adverse reactions and drug interactions. Written educational materials and patient instructions outlining all of the above were given.  Follow-up: for Botox   Genia Harold, MD 02/15/2022   10:45 AM

## 2022-02-15 ENCOUNTER — Telehealth: Payer: Self-pay | Admitting: Neurology

## 2022-02-15 ENCOUNTER — Encounter: Payer: Self-pay | Admitting: Psychiatry

## 2022-02-15 ENCOUNTER — Ambulatory Visit (INDEPENDENT_AMBULATORY_CARE_PROVIDER_SITE_OTHER): Payer: Medicare Other | Admitting: Psychiatry

## 2022-02-15 VITALS — BP 144/73 | HR 70 | Ht 66.0 in | Wt 136.0 lb

## 2022-02-15 DIAGNOSIS — G43719 Chronic migraine without aura, intractable, without status migrainosus: Secondary | ICD-10-CM | POA: Diagnosis not present

## 2022-02-15 MED ORDER — SUMATRIPTAN SUCCINATE 100 MG PO TABS: ORAL_TABLET | ORAL | 11 refills | Status: DC

## 2022-02-15 NOTE — Telephone Encounter (Signed)
Dr Billey Gosling would like to initiate treatment with Botox.  Will send to get the authorization process going for the pt. J Code- [id number] CPT [id number] ICD- E96.116

## 2022-02-16 ENCOUNTER — Other Ambulatory Visit (HOSPITAL_COMMUNITY): Payer: Self-pay

## 2022-02-16 NOTE — Telephone Encounter (Signed)
Patient Advocate Encounter   Received notification that prior authorization for Botox 200UNIT solution is required.   PA submitted on 02/16/2022 Key [id number]  Status is pending       Lyndel Safe, [place] Patient Advocate Specialist [facility] Patient Advocate Team Direct Number: [phone]  Fax: [phone]

## 2022-02-17 ENCOUNTER — Other Ambulatory Visit (HOSPITAL_COMMUNITY): Payer: Self-pay

## 2022-02-17 NOTE — Telephone Encounter (Signed)
Patient Advocate Encounter  Prior Authorization for Botox 200UNIT solution has been approved.    PA# [id number] Effective dates: 02/16/2022 through 03/06/2023  Can be filled at [facility], [facility] [place] Patient Advocate Team Direct Number: [phone]  Fax: [phone]

## 2022-02-20 NOTE — Telephone Encounter (Signed)
Scheduled with Jessica for 03/08/22 at 12:45 pm.

## 2022-03-01 ENCOUNTER — Other Ambulatory Visit (HOSPITAL_COMMUNITY): Payer: Self-pay

## 2022-03-01 MED ORDER — BOTOX 200 UNITS IJ SOLR
INTRAMUSCULAR | 2 refills | Status: AC
  Filled 2022-03-01: qty 1, fill #0

## 2022-03-01 NOTE — Telephone Encounter (Signed)
Please send Botox refill to [facility] as requested.  Thank you.

## 2022-03-01 NOTE — Addendum Note (Signed)
Addended by: Kristen Loader on: 03/01/2022 10:37 AM   Modules accepted: Orders

## 2022-03-01 NOTE — Telephone Encounter (Signed)
It looks like this prescription still needs to be sent to [facility] to be filled

## 2022-03-07 ENCOUNTER — Other Ambulatory Visit (HOSPITAL_COMMUNITY): Payer: Self-pay

## 2022-03-07 NOTE — Progress Notes (Deleted)
Update 03/08/2022 JM: Patient being seen for Botox injection, last injection 10/2021 through [facility] neurology.  Currently experiencing ***migraine days per month.  Use of sumatriptan for rescue with benefit.    Consent Form Botulism Toxin Injection For Chronic Migraine    Reviewed orally with patient, additionally signature is on file:  Botulism toxin has been approved by the Federal drug administration for treatment of chronic migraine. Botulism toxin does not cure chronic migraine and it may not be effective in some patients.  The administration of botulism toxin is accomplished by injecting a small amount of toxin into the muscles of the neck and head. Dosage must be titrated for each individual. Any benefits resulting from botulism toxin tend to wear off after 3 months with a repeat injection required if benefit is to be maintained. Injections are usually done every 3-4 months with maximum effect peak achieved by about 2 or 3 weeks. Botulism toxin is expensive and you should be sure of what costs you will incur resulting from the injection.  The side effects of botulism toxin use for chronic migraine may include:   -Transient, and usually mild, facial weakness with facial injections  -Transient, and usually mild, head or neck weakness with head/neck injections  -Reduction or loss of forehead facial animation due to forehead muscle weakness  -Eyelid drooping  -Dry eye  -Pain at the site of injection or bruising at the site of injection  -Double vision  -Potential unknown long term risks   Contraindications: You should not have Botox if you are pregnant, nursing, allergic to albumin, have an infection, skin condition, or muscle weakness at the site of the injection, or have myasthenia gravis, Lambert-Eaton syndrome, or ALS.  It is also possible that as with any injection, there may be an allergic reaction or no effect from the medication. Reduced effectiveness after repeated  injections is sometimes seen and rarely infection at the injection site may occur. All care will be taken to prevent these side effects. If therapy is given over a long time, atrophy and wasting in the muscle injected may occur. Occasionally the patient's become refractory to treatment because they develop antibodies to the toxin. In this event, therapy needs to be modified.  I have read the above information and consent to the administration of botulism toxin.    BOTOX PROCEDURE NOTE FOR MIGRAINE HEADACHE  Contraindications and precautions discussed with patient(above). Aseptic procedure was observed and patient tolerated procedure. Procedure performed by Frann Rider, AGNP-BC.   The condition has existed for more than 6 months, and pt does not have a diagnosis of ALS, Myasthenia Gravis or Lambert-Eaton Syndrome.  Risks and benefits of injections discussed and pt agrees to proceed with the procedure.  Written consent obtained  These injections are medically necessary. Pt  receives good benefits from these injections. These injections do not cause sedations or hallucinations which the oral therapies may cause.   Description of procedure:  The patient was placed in a sitting position. The standard protocol was used for Botox as follows, with 5 units of Botox injected at each site:  -Procerus muscle, midline injection  -Corrugator muscle, bilateral injection  -Frontalis muscle, bilateral injection, with 2 sites each side, medial injection was performed in the upper one third of the frontalis muscle, in the region vertical from the medial inferior edge of the superior orbital rim. The lateral injection was again in the upper one third of the forehead vertically above the lateral limbus of the cornea,  1.5 cm lateral to the medial injection site.  -Temporalis muscle injection, 4 sites, bilaterally. The first injection was 3 cm above the tragus of the ear, second injection site was 1.5 cm to 3 cm  up from the first injection site in line with the tragus of the ear. The third injection site was 1.5-3 cm forward between the first 2 injection sites. The fourth injection site was 1.5 cm posterior to the second injection site. 5th site laterally in the temporalis  muscleat the level of the outer canthus.  -Occipitalis muscle injection, 3 sites, bilaterally. The first injection was done one half way between the occipital protuberance and the tip of the mastoid process behind the ear. The second injection site was done lateral and superior to the first, 1 fingerbreadth from the first injection. The third injection site was 1 fingerbreadth superiorly and medially from the first injection site.  -Cervical paraspinal muscle injection, 2 sites, bilaterally. The first injection site was 1 cm from the midline of the cervical spine, 3 cm inferior to the lower border of the occipital protuberance. The second injection site was 1.5 cm superiorly and laterally to the first injection site.  -Trapezius muscle injection was performed at 3 sites, bilaterally. The first injection site was in the upper trapezius muscle halfway between the inflection point of the neck, and the acromion. The second injection site was one half way between the acromion and the first injection site. The third injection was done between the first injection site and the inflection point of the neck.    A total of 200 units of Botox was prepared, 155 units of Botox was injected as documented above, any Botox not injected was wasted. The patient tolerated the procedure well, there were no complications of the above procedure.   Frann Rider, AGNP-BC  [facility] [address] [place], [place] [address]  Phone [phone] Fax [phone] Note: This document was prepared with digital dictation and possible smart phrase technology. Any transcriptional errors that result from this process are  unintentional.

## 2022-03-08 ENCOUNTER — Ambulatory Visit: Payer: Medicare Other | Admitting: Adult Health

## 2022-03-09 ENCOUNTER — Telehealth: Payer: Self-pay | Admitting: Psychiatry

## 2022-03-09 NOTE — Telephone Encounter (Signed)
Patient wants to be sure that the [facility] has both of her insurance information for her Botox. She has MCR and BCBS as a supplement - E-verified and updated in her chart. Concerned over a deductible she never had to pay before. Best call back is [phone]

## 2022-03-15 ENCOUNTER — Other Ambulatory Visit (HOSPITAL_COMMUNITY): Payer: Self-pay

## 2022-03-15 NOTE — Telephone Encounter (Signed)
Running a test claim, it does appear that patient has a deductible of $545. Unfortunately, I am unable to tell her why there is a deductible, she will have to contact her insurance company to talk to them.

## 2022-03-15 NOTE — Telephone Encounter (Signed)
I only see [employer] insurance in [place] I run eligibility check this is the only one coming up-I also do not see any insurance cards uploaded recently-can you get me the information needed to add the other insurance information such as the [facility] Number, PCN, Group Number, and member ID.

## 2022-03-17 ENCOUNTER — Other Ambulatory Visit (HOSPITAL_COMMUNITY): Payer: Self-pay

## 2022-03-17 NOTE — Telephone Encounter (Signed)
  Per [employer] portal it is statin the PT is not covered for pharmacy benefits-please see below.

## 2022-03-20 ENCOUNTER — Other Ambulatory Visit (HOSPITAL_COMMUNITY): Payer: Self-pay

## 2022-03-21 ENCOUNTER — Encounter: Payer: Self-pay | Admitting: Neurology

## 2022-03-21 ENCOUNTER — Other Ambulatory Visit (HOSPITAL_COMMUNITY): Payer: Self-pay

## 2022-03-21 NOTE — Telephone Encounter (Signed)
   It is covered with her [employer]- Her copay is high-   PT will need to call her insurance to discuss. The PA has been approved and it is going thru her insurance but the [employer] does not have a prescription coverage so it sounds like PT is confused about her coverages and would advise her to call them.

## 2022-03-21 NOTE — Telephone Encounter (Signed)
Medicare supplement plans don't include prescription drug coverage. You'll need a separate Medicare Part D prescription drug plan

## 2022-03-22 NOTE — Telephone Encounter (Signed)
Per pt mychart message, "I just spoke with [facility] insurance and they are telling me that this request for treatment is listed with them as cosmetic. Therefore, they will need authorization from you saying that it is for migraine treatment only. They will then fully cover this treatment. Will you be able to help me with this? Medicare [phone]  in addition, I am assuming that the pharmacy must be going under the same premise as the insurance co. That this is cosmetic and not medical. Would your office be able to notify them as well?"

## 2022-03-23 ENCOUNTER — Ambulatory Visit: Payer: Medicare Other | Admitting: Adult Health

## 2022-04-03 ENCOUNTER — Other Ambulatory Visit (HOSPITAL_COMMUNITY): Payer: Self-pay

## 2022-04-03 NOTE — Telephone Encounter (Signed)
  The PA was submitted under medical with the above diagnosis.   I will run a [employer]otox[employer] Benefits Verification on her for 2024 and will update.

## 2022-04-03 NOTE — Telephone Encounter (Signed)
Benefit Verification [id number] Submitted!

## 2022-04-03 NOTE — Telephone Encounter (Signed)
Per further investigation I could not find where a PA had been submitted to the medical portion-her supplement which is [employer] of [place], and yes if it is medicare then botox would be covered under the buy and bill option. The BotoxOne verification gives a breakdown of the PT insurance benefits and how it should be processed. Hopefully we can get this taken care of for the pt.

## 2022-04-03 NOTE — Telephone Encounter (Signed)
Submitted new PA request via FAX to [facility] [place] for medical coverage on 04/03/2022.

## 2022-04-04 ENCOUNTER — Other Ambulatory Visit (HOSPITAL_COMMUNITY): Payer: Self-pay

## 2022-04-04 NOTE — Telephone Encounter (Signed)
Pt scheduled for botox with Jessica for 04/24/22 at 9:45am and appointment was also added to wait list

## 2022-04-04 NOTE — Telephone Encounter (Signed)
I verified PT has part B original medicare and blue Cross supplement-I called [employer]BS (50 min hold time) to verify the following benefits for the Pt.   No PA needed for Botox under her [employer] supplement this will be Buy and [employer],  [employer] is 20% Deductible-$240 No [employer]

## 2022-04-24 ENCOUNTER — Encounter: Payer: Self-pay | Admitting: Adult Health

## 2022-04-24 ENCOUNTER — Ambulatory Visit (INDEPENDENT_AMBULATORY_CARE_PROVIDER_SITE_OTHER): Payer: Medicare Other | Admitting: Adult Health

## 2022-04-24 DIAGNOSIS — G43719 Chronic migraine without aura, intractable, without status migrainosus: Secondary | ICD-10-CM | POA: Diagnosis not present

## 2022-04-24 MED ORDER — ONABOTULINUMTOXINA 200 UNITS IJ SOLR
155.0000 [IU] | Freq: Once | INTRAMUSCULAR | Status: AC
  Administered 2022-04-24: 155 [IU] via INTRAMUSCULAR

## 2022-04-24 NOTE — Progress Notes (Signed)
Update 04/24/2022 JM: Patient is being seen for initial Botox injection in this office.  Was previously receiving Botox injection from [facility], last injection 10/2021.  Has been experiencing increased migraine headaches due to duration between Botox injections, currently experiencing about 2-3 migraines per week (previously 4 migraines per month). Use of sumatriptan with benefit.  Tolerated procedure well today.  Will return in 3 months for repeat injection.       Consent Form Botulism Toxin Injection For Chronic Migraine    Reviewed orally with patient, additionally signature is on file:  Botulism toxin has been approved by the Federal drug administration for treatment of chronic migraine. Botulism toxin does not cure chronic migraine and it may not be effective in some patients.  The administration of botulism toxin is accomplished by injecting a small amount of toxin into the muscles of the neck and head. Dosage must be titrated for each individual. Any benefits resulting from botulism toxin tend to wear off after 3 months with a repeat injection required if benefit is to be maintained. Injections are usually done every 3-4 months with maximum effect peak achieved by about 2 or 3 weeks. Botulism toxin is expensive and you should be sure of what costs you will incur resulting from the injection.  The side effects of botulism toxin use for chronic migraine may include:   -Transient, and usually mild, facial weakness with facial injections  -Transient, and usually mild, head or neck weakness with head/neck injections  -Reduction or loss of forehead facial animation due to forehead muscle weakness  -Eyelid drooping  -Dry eye  -Pain at the site of injection or bruising at the site of injection  -Double vision  -Potential unknown long term risks   Contraindications: You should not have Botox if you are pregnant, nursing, allergic to albumin, have an infection, skin  condition, or muscle weakness at the site of the injection, or have myasthenia gravis, Lambert-Eaton syndrome, or ALS.  It is also possible that as with any injection, there may be an allergic reaction or no effect from the medication. Reduced effectiveness after repeated injections is sometimes seen and rarely infection at the injection site may occur. All care will be taken to prevent these side effects. If therapy is given over a long time, atrophy and wasting in the muscle injected may occur. Occasionally the patient's become refractory to treatment because they develop antibodies to the toxin. In this event, therapy needs to be modified.  I have read the above information and consent to the administration of botulism toxin.    BOTOX PROCEDURE NOTE FOR MIGRAINE HEADACHE  Contraindications and precautions discussed with patient(above). Aseptic procedure was observed and patient tolerated procedure. Procedure performed by Frann Rider, AGNP-BC.   The condition has existed for more than 6 months, and pt does not have a diagnosis of ALS, Myasthenia Gravis or Lambert-Eaton Syndrome.  Risks and benefits of injections discussed and pt agrees to proceed with the procedure.  Written consent obtained  These injections are medically necessary. Pt  receives good benefits from these injections. These injections do not cause sedations or hallucinations which the oral therapies may cause.   Description of procedure:  The patient was placed in a sitting position. The standard protocol was used for Botox as follows, with 5 units of Botox injected at each site:  -Procerus muscle, midline injection  -Corrugator muscle, bilateral injection  -Frontalis muscle, bilateral injection, with 2 sites each side, medial injection was  performed in the upper one third of the frontalis muscle, in the region vertical from the medial inferior edge of the superior orbital rim. The lateral injection was again in the upper  one third of the forehead vertically above the lateral limbus of the cornea, 1.5 cm lateral to the medial injection site.  -Temporalis muscle injection, 4 sites, bilaterally. The first injection was 3 cm above the tragus of the ear, second injection site was 1.5 cm to 3 cm up from the first injection site in line with the tragus of the ear. The third injection site was 1.5-3 cm forward between the first 2 injection sites. The fourth injection site was 1.5 cm posterior to the second injection site. 5th site laterally in the temporalis  muscleat the level of the outer canthus.  -Occipitalis muscle injection, 3 sites, bilaterally. The first injection was done one half way between the occipital protuberance and the tip of the mastoid process behind the ear. The second injection site was done lateral and superior to the first, 1 fingerbreadth from the first injection. The third injection site was 1 fingerbreadth superiorly and medially from the first injection site.  -Cervical paraspinal muscle injection, 2 sites, bilaterally. The first injection site was 1 cm from the midline of the cervical spine, 3 cm inferior to the lower border of the occipital protuberance. The second injection site was 1.5 cm superiorly and laterally to the first injection site.  -Trapezius muscle injection was performed at 3 sites, bilaterally. The first injection site was in the upper trapezius muscle halfway between the inflection point of the neck, and the acromion. The second injection site was one half way between the acromion and the first injection site. The third injection was done between the first injection site and the inflection point of the neck.    A total of 200 units of Botox was prepared, 155 units of Botox was injected as documented above, any Botox not injected was wasted. The patient tolerated the procedure well, there were no complications of the above procedure.   Frann Rider, AGNP-BC  [facility] [address] [place], [place] [address]  Phone [phone] Fax [phone] Note: This document was prepared with digital dictation and possible smart phrase technology. Any transcriptional errors that result from this process are unintentional.

## 2022-04-24 NOTE — Progress Notes (Signed)
Botox- 200 units x 1 vial Lot: [id number] Expiration: 08/2024 NDC: [id number]   Bacteriostatic 0.9% Sodium Chloride- 72m total [id number]: 11/25 NDC: [id number]  Dx: [id number]

## 2022-07-13 IMAGING — CT CT CHEST W/O CM
2 of 4 series · 15 of 36 positions shown, 18 images · non-contrast
Comparison: 04/21/2019

CLINICAL DATA: Right breast cancer, follow-up pulmonary nodule

EXAM:
CT CHEST WITHOUT CONTRAST
TECHNIQUE: Multidetector CT imaging of the chest was performed following the
standard protocol without IV contrast.

[Series 2: thorax · axial · 0.70mm/px · z∈[+1312,+1616]mm · 12 of 178 slices shown, 15 images]
[im 13/178  mediastinal]
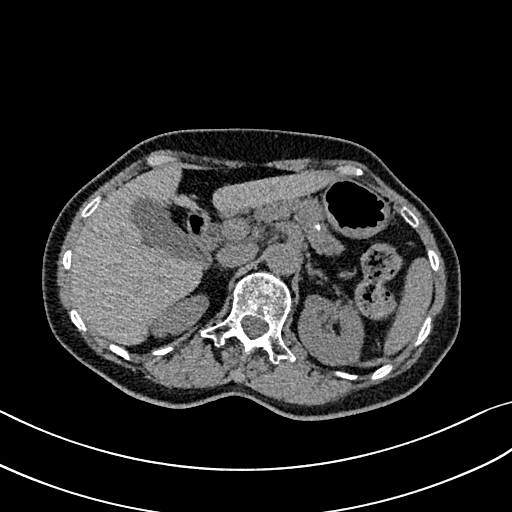
[im 13/178  lung]
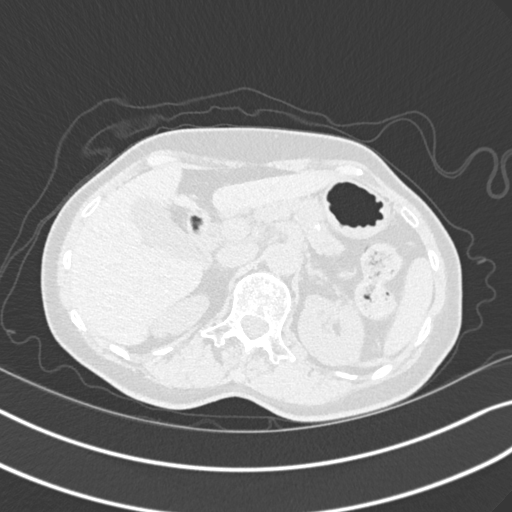
[im 26/178  lung]
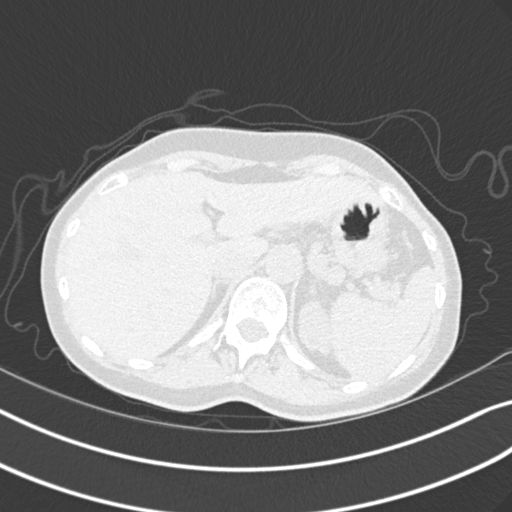
[im 38/178  lung]
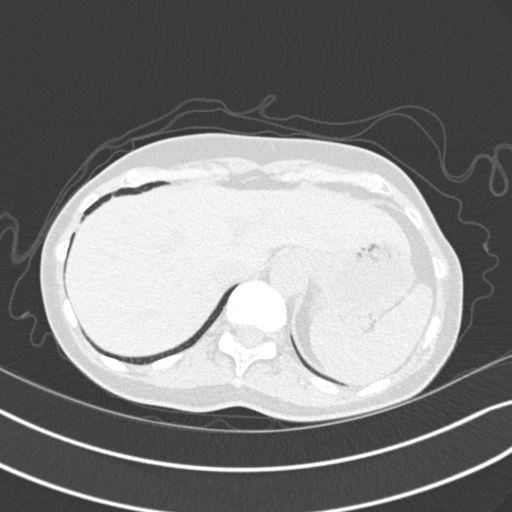
[im 51/178  lung]
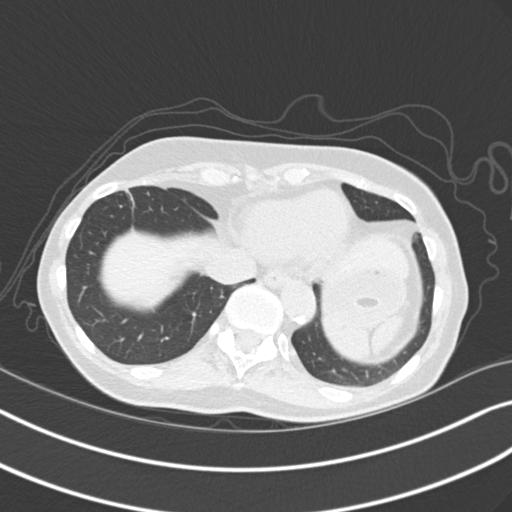
[im 64/178  mediastinal]
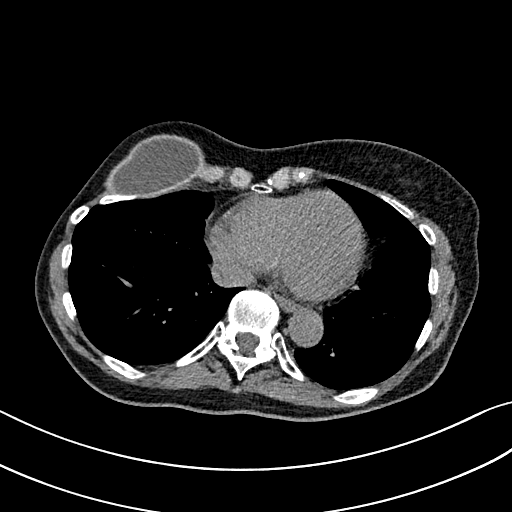
[im 64/178  lung]
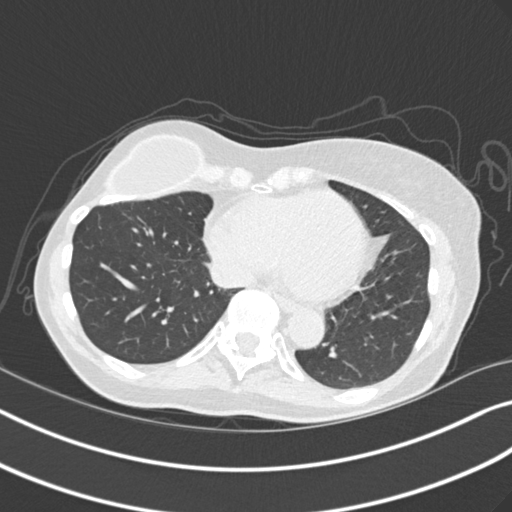
[im 76/178  lung]
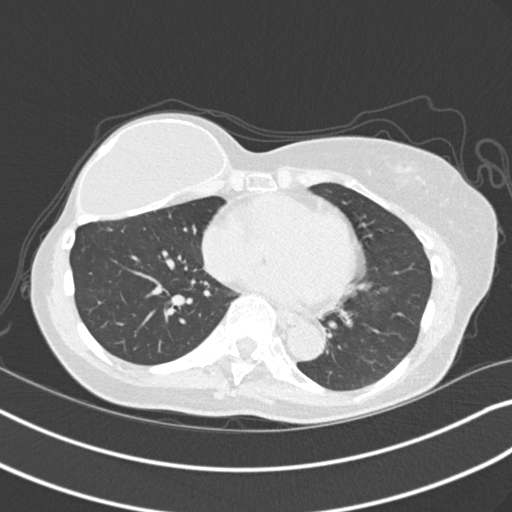
[im 102/178  lung]
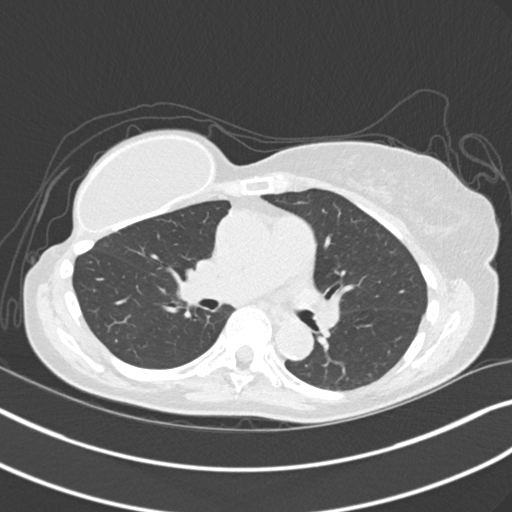
[im 114/178  lung]
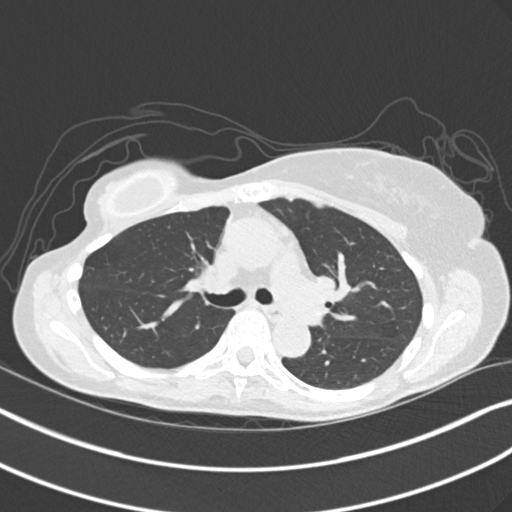
[im 127/178  mediastinal]
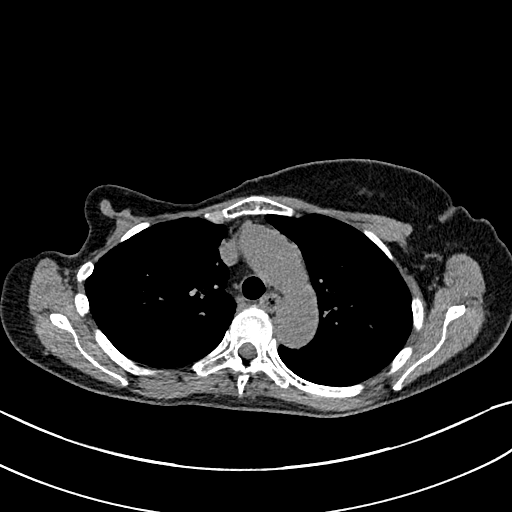
[im 127/178  lung]
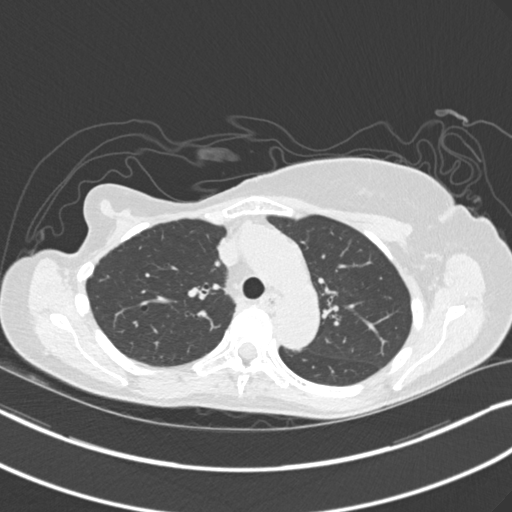
[im 140/178  lung]
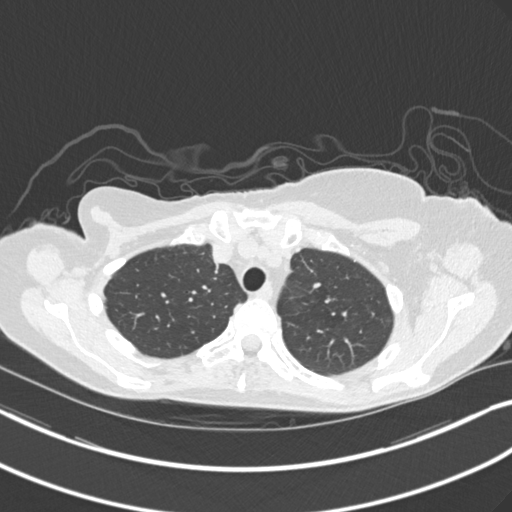
[im 152/178  lung]
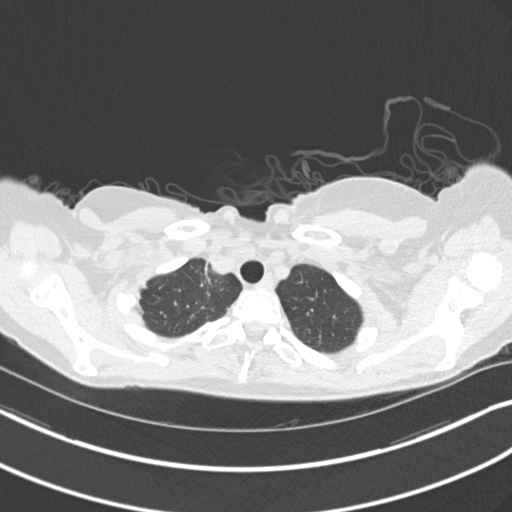
[im 165/178  lung]
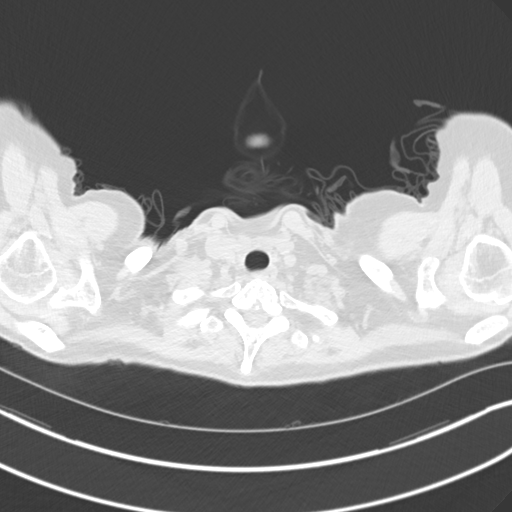

[Series 5: coronal · coronal · 0.72mm/px · 3 of 106 slices shown]
[im 22/106  lung]
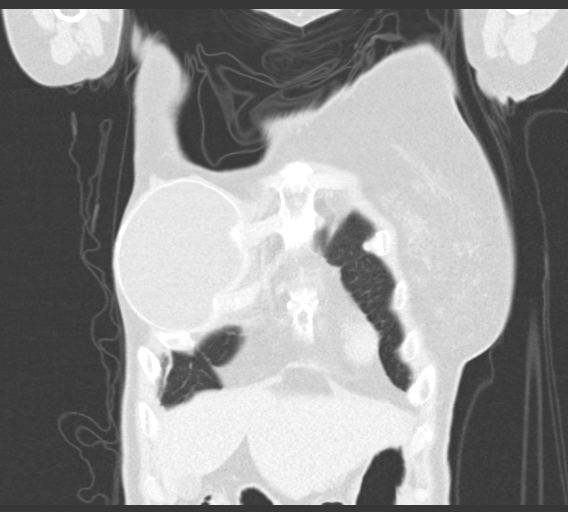
[im 43/106  lung]
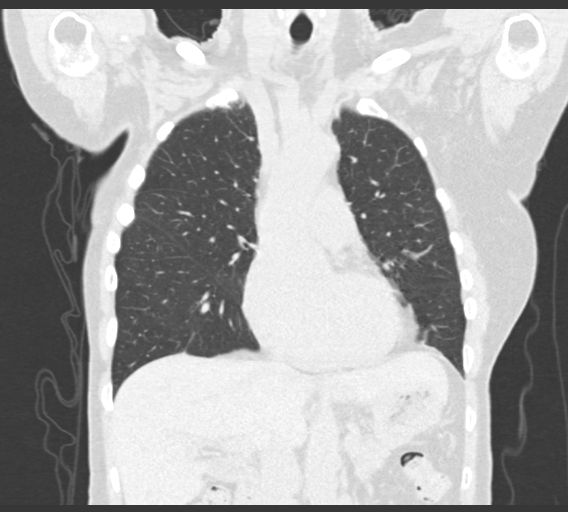
[im 64/106  lung]
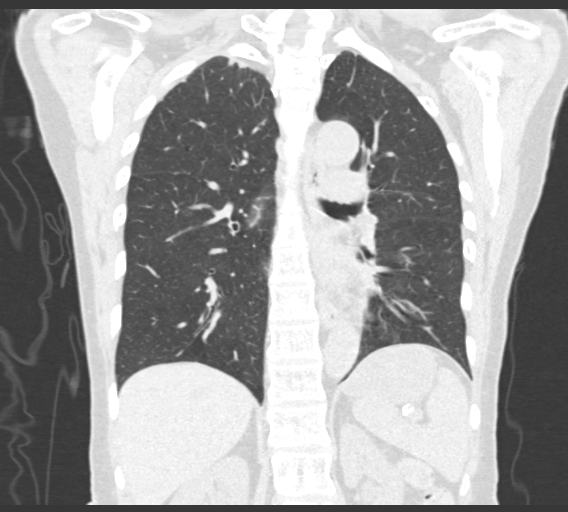

[15 of 36 positions shown; findings below may reference images not displayed]

FINDINGS: Cardiovascular: Heart is normal in size.  No pericardial effusion.

No evidence of thoracic aortic aneurysm. Mild atherosclerotic
calcifications of the aortic arch.

Mild coronary atherosclerosis the LAD.

Mediastinum/Nodes: No suspicious mediastinal lymphadenopathy.

Lungs/Pleura: Radiation changes in the medial right lung apex.

No focal consolidation.

7 mm nodule in the medial right upper lobe (series 7/image 43),
previously 8 mm, unchanged. 2 mm nodule in the left lower lobe
(series 7/image 105), unchanged. 3 mm calcified granuloma in the
anterior right lower lobe (series 7/image 62), benign. No
new/suspicious pulmonary nodules.

No pleural effusion or pneumothorax.

Upper Abdomen: Visualized upper abdomen is grossly unremarkable.

Musculoskeletal: Status post right mastectomy with reconstruction.

Visualized osseous structures are within normal limits.
IMPRESSION: Status post right mastectomy. Radiation changes in the medial right
upper lobe. No findings suspicious for metastatic disease.

Stable bilateral pulmonary nodules, benign.

Aortic Atherosclerosis ([id number]).

## 2022-07-18 NOTE — Progress Notes (Unsigned)
07/19/22 ALL: Sonia Bell returns for second Botox procedure with our office, has been getting Botox with [facility]. Last procedure with Shanda Bumps 04/2022. She reports 2-3 migraines per month. Sumatriptan works well.   04/24/2022 JM: Patient is being seen for initial Botox injection in this office.  Was previously receiving Botox injection from [facility], last injection 10/2021.  Has been experiencing increased migraine headaches due to duration between Botox injections, currently experiencing about 2-3 migraines per week (previously 4 migraines per month). Use of sumatriptan with benefit.  Tolerated procedure well today.  Will return in 3 months for repeat injection.   Consent Form Botulism Toxin Injection For Chronic Migraine    Reviewed orally with patient, additionally signature is on file:  Botulism toxin has been approved by the Federal drug administration for treatment of chronic migraine. Botulism toxin does not cure chronic migraine and it may not be effective in some patients.  The administration of botulism toxin is accomplished by injecting a small amount of toxin into the muscles of the neck and head. Dosage must be titrated for each individual. Any benefits resulting from botulism toxin tend to wear off after 3 months with a repeat injection required if benefit is to be maintained. Injections are usually done every 3-4 months with maximum effect peak achieved by about 2 or 3 weeks. Botulism toxin is expensive and you should be sure of what costs you will incur resulting from the injection.  The side effects of botulism toxin use for chronic migraine may include:   -Transient, and usually mild, facial weakness with facial injections  -Transient, and usually mild, head or neck weakness with head/neck injections  -Reduction or loss of forehead facial animation due to forehead muscle weakness  -Eyelid drooping  -Dry eye  -Pain at the site of injection or bruising at the site of  injection  -Double vision  -Potential unknown long term risks   Contraindications: You should not have Botox if you are pregnant, nursing, allergic to albumin, have an infection, skin condition, or muscle weakness at the site of the injection, or have myasthenia gravis, Lambert-Eaton syndrome, or ALS.  It is also possible that as with any injection, there may be an allergic reaction or no effect from the medication. Reduced effectiveness after repeated injections is sometimes seen and rarely infection at the injection site may occur. All care will be taken to prevent these side effects. If therapy is given over a long time, atrophy and wasting in the muscle injected may occur. Occasionally the patient's become refractory to treatment because they develop antibodies to the toxin. In this event, therapy needs to be modified.  I have read the above information and consent to the administration of botulism toxin.    BOTOX PROCEDURE NOTE FOR MIGRAINE HEADACHE  Contraindications and precautions discussed with patient(above). Aseptic procedure was observed and patient tolerated procedure. Procedure performed by Shawnie Dapper, FNP-C.   The condition has existed for more than 6 months, and pt does not have a diagnosis of ALS, Myasthenia Gravis or Lambert-Eaton Syndrome.  Risks and benefits of injections discussed and pt agrees to proceed with the procedure.  Written consent obtained  These injections are medically necessary. Pt  receives good benefits from these injections. These injections do not cause sedations or hallucinations which the oral therapies may cause.   Description of procedure:  The patient was placed in a sitting position. The standard protocol was used for Botox as follows, with 5 units of Botox  injected at each site:  -Procerus muscle, midline injection  -Corrugator muscle, bilateral injection  -Frontalis muscle, bilateral injection, with 2 sites each side, medial injection was  performed in the upper one third of the frontalis muscle, in the region vertical from the medial inferior edge of the superior orbital rim. The lateral injection was again in the upper one third of the forehead vertically above the lateral limbus of the cornea, 1.5 cm lateral to the medial injection site.  -Temporalis muscle injection, 4 sites, bilaterally. The first injection was 3 cm above the tragus of the ear, second injection site was 1.5 cm to 3 cm up from the first injection site in line with the tragus of the ear. The third injection site was 1.5-3 cm forward between the first 2 injection sites. The fourth injection site was 1.5 cm posterior to the second injection site. 5th site laterally in the temporalis  muscleat the level of the outer canthus.  -Occipitalis muscle injection, 3 sites, bilaterally. The first injection was done one half way between the occipital protuberance and the tip of the mastoid process behind the ear. The second injection site was done lateral and superior to the first, 1 fingerbreadth from the first injection. The third injection site was 1 fingerbreadth superiorly and medially from the first injection site.  -Cervical paraspinal muscle injection, 2 sites, bilaterally. The first injection site was 1 cm from the midline of the cervical spine, 3 cm inferior to the lower border of the occipital protuberance. The second injection site was 1.5 cm superiorly and laterally to the first injection site.  -Trapezius muscle injection was performed at 3 sites, bilaterally. The first injection site was in the upper trapezius muscle halfway between the inflection point of the neck, and the acromion. The second injection site was one half way between the acromion and the first injection site. The third injection was done between the first injection site and the inflection point of the neck.   Will return for repeat injection in 3 months.   A total of 200 units of Botox was prepared,  155 units of Botox was injected as documented above, any Botox not injected was wasted. The patient tolerated the procedure well, there were no complications of the above procedure.

## 2022-07-19 ENCOUNTER — Ambulatory Visit (INDEPENDENT_AMBULATORY_CARE_PROVIDER_SITE_OTHER): Payer: Medicare Other | Admitting: Family Medicine

## 2022-07-19 DIAGNOSIS — G43719 Chronic migraine without aura, intractable, without status migrainosus: Secondary | ICD-10-CM | POA: Diagnosis not present

## 2022-07-19 MED ORDER — ONABOTULINUMTOXINA 200 UNITS IJ SOLR
155.0000 [IU] | Freq: Once | INTRAMUSCULAR | Status: AC
  Administered 2022-07-19: 155 [IU] via INTRAMUSCULAR

## 2022-07-19 NOTE — Progress Notes (Signed)
Botox- 200 units x 1 vial Lot: [id number] Expiration: 08/2024 NDC: 5956-3875-64  Bacteriostatic 0.9% Sodium Chloride- * mL  Lot: [id number] Expiration: 11/25 NDC: 84166-063-01  Dx: G43.719 B/B Witnessed by Landry Dyke, Otilio Connors

## 2022-10-11 ENCOUNTER — Ambulatory Visit: Payer: Medicare Other | Admitting: Adult Health

## 2022-10-11 ENCOUNTER — Ambulatory Visit (INDEPENDENT_AMBULATORY_CARE_PROVIDER_SITE_OTHER): Payer: Medicare Other | Admitting: Adult Health

## 2022-10-11 DIAGNOSIS — G43719 Chronic migraine without aura, intractable, without status migrainosus: Secondary | ICD-10-CM

## 2022-10-11 MED ORDER — ONABOTULINUMTOXINA 200 UNITS IJ SOLR
155.0000 [IU] | Freq: Once | INTRAMUSCULAR | Status: AC
  Administered 2022-10-11: 155 [IU] via INTRAMUSCULAR

## 2022-10-11 NOTE — Progress Notes (Addendum)
Botox- 200 units x 1 vial Lot: [id number] Expiration: 01/2025 NDC: 1610-9604-54  Bacteriostatic 0.9% Sodium Chloride- * mL  Lot: [id number] Expiration: 01/2024 NDC: 1478-2956-21  Dx: 43.719 B/B  Witnessed by Leandra Kern

## 2022-10-11 NOTE — Progress Notes (Signed)
Update 10/11/2022 JM: Returns for repeat Botox.  Prior injection 07/19/2022 by Shawnie Dapper, NP.  Continues to experience 2-3 migraines per month, some worsening this past week due to weather, uses sumatriptan with benefit.  Tolerated procedure well today.  Return in 3 months for repeat injection.      Consent Form Botulism Toxin Injection For Chronic Migraine    Reviewed orally with patient, additionally signature is on file:  Botulism toxin has been approved by the Federal drug administration for treatment of chronic migraine. Botulism toxin does not cure chronic migraine and it may not be effective in some patients.  The administration of botulism toxin is accomplished by injecting a small amount of toxin into the muscles of the neck and head. Dosage must be titrated for each individual. Any benefits resulting from botulism toxin tend to wear off after 3 months with a repeat injection required if benefit is to be maintained. Injections are usually done every 3-4 months with maximum effect peak achieved by about 2 or 3 weeks. Botulism toxin is expensive and you should be sure of what costs you will incur resulting from the injection.  The side effects of botulism toxin use for chronic migraine may include:   -Transient, and usually mild, facial weakness with facial injections  -Transient, and usually mild, head or neck weakness with head/neck injections  -Reduction or loss of forehead facial animation due to forehead muscle weakness  -Eyelid drooping  -Dry eye  -Pain at the site of injection or bruising at the site of injection  -Double vision  -Potential unknown long term risks   Contraindications: You should not have Botox if you are pregnant, nursing, allergic to albumin, have an infection, skin condition, or muscle weakness at the site of the injection, or have myasthenia gravis, Lambert-Eaton syndrome, or ALS.  It is also possible that as with any injection, there may be an  allergic reaction or no effect from the medication. Reduced effectiveness after repeated injections is sometimes seen and rarely infection at the injection site may occur. All care will be taken to prevent these side effects. If therapy is given over a long time, atrophy and wasting in the muscle injected may occur. Occasionally the patient's become refractory to treatment because they develop antibodies to the toxin. In this event, therapy needs to be modified.  I have read the above information and consent to the administration of botulism toxin.    BOTOX PROCEDURE NOTE FOR MIGRAINE HEADACHE  Contraindications and precautions discussed with patient(above). Aseptic procedure was observed and patient tolerated procedure. Procedure performed by Ihor Austin, AGNP-BC.   The condition has existed for more than 6 months, and pt does not have a diagnosis of ALS, Myasthenia Gravis or Lambert-Eaton Syndrome.  Risks and benefits of injections discussed and pt agrees to proceed with the procedure.  Written consent obtained  These injections are medically necessary. Pt  receives good benefits from these injections. These injections do not cause sedations or hallucinations which the oral therapies may cause.   Description of procedure:  The patient was placed in a sitting position. The standard protocol was used for Botox as follows, with 5 units of Botox injected at each site:  -Procerus muscle, midline injection  -Corrugator muscle, bilateral injection  -Frontalis muscle, bilateral injection, with 2 sites each side, medial injection was performed in the upper one third of the frontalis muscle, in the region vertical from the medial inferior edge of the superior orbital rim.  The lateral injection was again in the upper one third of the forehead vertically above the lateral limbus of the cornea, 1.5 cm lateral to the medial injection site.  -Temporalis muscle injection, 4 sites, bilaterally. The first  injection was 3 cm above the tragus of the ear, second injection site was 1.5 cm to 3 cm up from the first injection site in line with the tragus of the ear. The third injection site was 1.5-3 cm forward between the first 2 injection sites. The fourth injection site was 1.5 cm posterior to the second injection site. 5th site laterally in the temporalis  muscleat the level of the outer canthus.  -Occipitalis muscle injection, 3 sites, bilaterally. The first injection was done one half way between the occipital protuberance and the tip of the mastoid process behind the ear. The second injection site was done lateral and superior to the first, 1 fingerbreadth from the first injection. The third injection site was 1 fingerbreadth superiorly and medially from the first injection site.  -Cervical paraspinal muscle injection, 2 sites, bilaterally. The first injection site was 1 cm from the midline of the cervical spine, 3 cm inferior to the lower border of the occipital protuberance. The second injection site was 1.5 cm superiorly and laterally to the first injection site.  -Trapezius muscle injection was performed at 3 sites, bilaterally. The first injection site was in the upper trapezius muscle halfway between the inflection point of the neck, and the acromion. The second injection site was one half way between the acromion and the first injection site. The third injection was done between the first injection site and the inflection point of the neck.    A total of 200 units of Botox was prepared, 155 units of Botox was injected as documented above, any Botox not injected was wasted. The patient tolerated the procedure well, there were no complications of the above procedure.   Ihor Austin, AGNP-BC  [facility] [address] [place], [place] [address]  Phone [phone] Fax [phone] Note: This document was prepared with digital dictation and possible smart  phrase technology. Any transcriptional errors that result from this process are unintentional.

## 2023-01-03 ENCOUNTER — Ambulatory Visit (INDEPENDENT_AMBULATORY_CARE_PROVIDER_SITE_OTHER): Payer: Medicare Other | Admitting: Adult Health

## 2023-01-03 DIAGNOSIS — G43719 Chronic migraine without aura, intractable, without status migrainosus: Secondary | ICD-10-CM

## 2023-01-03 MED ORDER — ONABOTULINUMTOXINA 100 UNITS IJ SOLR
155.0000 [IU] | Freq: Once | INTRAMUSCULAR | Status: AC
  Administered 2023-01-03: 155 [IU] via INTRAMUSCULAR

## 2023-01-03 NOTE — Progress Notes (Signed)
Botox- 100 units x 2 vial Lot: [id number] Expiration: 05/2025 NDC: 4696-2952-84   Bacteriostatic 0.9% Sodium Chloride- 4mL total [id number] Expiration: 06/05/23 NDC: 7253-6644-03   Dx: G43.719 BB   Witnessed by: Hollice Espy

## 2023-01-03 NOTE — Progress Notes (Signed)
Patient returns for repeat Botox.  Prior injection 10/11/2022. Continues to get great benefit with botox, has about 3 migraines per month, use of sumatriptan with benefit.  Tolerated procedure well today. Will return in 3 months for repeat injection.         Consent Form Botulism Toxin Injection For Chronic Migraine    Reviewed orally with patient, additionally signature is on file:  Botulism toxin has been approved by the Federal drug administration for treatment of chronic migraine. Botulism toxin does not cure chronic migraine and it may not be effective in some patients.  The administration of botulism toxin is accomplished by injecting a small amount of toxin into the muscles of the neck and head. Dosage must be titrated for each individual. Any benefits resulting from botulism toxin tend to wear off after 3 months with a repeat injection required if benefit is to be maintained. Injections are usually done every 3-4 months with maximum effect peak achieved by about 2 or 3 weeks. Botulism toxin is expensive and you should be sure of what costs you will incur resulting from the injection.  The side effects of botulism toxin use for chronic migraine may include:   -Transient, and usually mild, facial weakness with facial injections  -Transient, and usually mild, head or neck weakness with head/neck injections  -Reduction or loss of forehead facial animation due to forehead muscle weakness  -Eyelid drooping  -Dry eye  -Pain at the site of injection or bruising at the site of injection  -Double vision  -Potential unknown long term risks   Contraindications: You should not have Botox if you are pregnant, nursing, allergic to albumin, have an infection, skin condition, or muscle weakness at the site of the injection, or have myasthenia gravis, Lambert-Eaton syndrome, or ALS.  It is also possible that as with any injection, there may be an allergic reaction or no effect from the  medication. Reduced effectiveness after repeated injections is sometimes seen and rarely infection at the injection site may occur. All care will be taken to prevent these side effects. If therapy is given over a long time, atrophy and wasting in the muscle injected may occur. Occasionally the patient's become refractory to treatment because they develop antibodies to the toxin. In this event, therapy needs to be modified.  I have read the above information and consent to the administration of botulism toxin.    BOTOX PROCEDURE NOTE FOR MIGRAINE HEADACHE  Contraindications and precautions discussed with patient(above). Aseptic procedure was observed and patient tolerated procedure. Procedure performed by Ihor Austin, AGNP-BC.   The condition has existed for more than 6 months, and pt does not have a diagnosis of ALS, Myasthenia Gravis or Lambert-Eaton Syndrome.  Risks and benefits of injections discussed and pt agrees to proceed with the procedure.  Written consent obtained  These injections are medically necessary. Pt  receives good benefits from these injections. These injections do not cause sedations or hallucinations which the oral therapies may cause.   Description of procedure:  The patient was placed in a sitting position. The standard protocol was used for Botox as follows, with 5 units of Botox injected at each site:  -Procerus muscle, midline injection  -Corrugator muscle, bilateral injection  -Frontalis muscle, bilateral injection, with 2 sites each side, medial injection was performed in the upper one third of the frontalis muscle, in the region vertical from the medial inferior edge of the superior orbital rim. The lateral injection was again  in the upper one third of the forehead vertically above the lateral limbus of the cornea, 1.5 cm lateral to the medial injection site.  -Temporalis muscle injection, 4 sites, bilaterally. The first injection was 3 cm above the tragus of  the ear, second injection site was 1.5 cm to 3 cm up from the first injection site in line with the tragus of the ear. The third injection site was 1.5-3 cm forward between the first 2 injection sites. The fourth injection site was 1.5 cm posterior to the second injection site. 5th site laterally in the temporalis  muscleat the level of the outer canthus.  -Occipitalis muscle injection, 3 sites, bilaterally. The first injection was done one half way between the occipital protuberance and the tip of the mastoid process behind the ear. The second injection site was done lateral and superior to the first, 1 fingerbreadth from the first injection. The third injection site was 1 fingerbreadth superiorly and medially from the first injection site.  -Cervical paraspinal muscle injection, 2 sites, bilaterally. The first injection site was 1 cm from the midline of the cervical spine, 3 cm inferior to the lower border of the occipital protuberance. The second injection site was 1.5 cm superiorly and laterally to the first injection site.  -Trapezius muscle injection was performed at 3 sites, bilaterally. The first injection site was in the upper trapezius muscle halfway between the inflection point of the neck, and the acromion. The second injection site was one half way between the acromion and the first injection site. The third injection was done between the first injection site and the inflection point of the neck.    A total of 200 units of Botox was prepared, 155 units of Botox was injected as documented above, any Botox not injected was wasted. The patient tolerated the procedure well, there were no complications of the above procedure.   Ihor Austin, AGNP-BC  [facility] [address] [place], [place] [address]  Phone [phone] Fax [phone] Note: This document was prepared with digital dictation and possible smart phrase technology. Any transcriptional  errors that result from this process are unintentional.

## 2023-03-12 ENCOUNTER — Other Ambulatory Visit: Payer: Self-pay | Admitting: Pulmonary Disease

## 2023-03-13 ENCOUNTER — Other Ambulatory Visit: Payer: Self-pay

## 2023-03-28 NOTE — Progress Notes (Deleted)
03/28/23 ALL: Betzaira returns for Botox. Sees Jessica,NP. Last procedure 01/03/2023.   07/19/2022 ALL: Satvika returns for second Botox procedure with our office, has been getting Botox with [facility]. Last procedure with Shanda Bumps 04/2022. She reports 2-3 migraines per month. Sumatriptan works well.   04/24/2022 JM: Patient is being seen for initial Botox injection in this office.  Was previously receiving Botox injection from [facility], last injection 10/2021.  Has been experiencing increased migraine headaches due to duration between Botox injections, currently experiencing about 2-3 migraines per week (previously 4 migraines per month). Use of sumatriptan with benefit.  Tolerated procedure well today.  Will return in 3 months for repeat injection.   Consent Form Botulism Toxin Injection For Chronic Migraine    Reviewed orally with patient, additionally signature is on file:  Botulism toxin has been approved by the Federal drug administration for treatment of chronic migraine. Botulism toxin does not cure chronic migraine and it may not be effective in some patients.  The administration of botulism toxin is accomplished by injecting a small amount of toxin into the muscles of the neck and head. Dosage must be titrated for each individual. Any benefits resulting from botulism toxin tend to wear off after 3 months with a repeat injection required if benefit is to be maintained. Injections are usually done every 3-4 months with maximum effect peak achieved by about 2 or 3 weeks. Botulism toxin is expensive and you should be sure of what costs you will incur resulting from the injection.  The side effects of botulism toxin use for chronic migraine may include:   -Transient, and usually mild, facial weakness with facial injections  -Transient, and usually mild, head or neck weakness with head/neck injections  -Reduction or loss of forehead facial animation due to forehead muscle  weakness  -Eyelid drooping  -Dry eye  -Pain at the site of injection or bruising at the site of injection  -Double vision  -Potential unknown long term risks   Contraindications: You should not have Botox if you are pregnant, nursing, allergic to albumin, have an infection, skin condition, or muscle weakness at the site of the injection, or have myasthenia gravis, Lambert-Eaton syndrome, or ALS.  It is also possible that as with any injection, there may be an allergic reaction or no effect from the medication. Reduced effectiveness after repeated injections is sometimes seen and rarely infection at the injection site may occur. All care will be taken to prevent these side effects. If therapy is given over a long time, atrophy and wasting in the muscle injected may occur. Occasionally the patient's become refractory to treatment because they develop antibodies to the toxin. In this event, therapy needs to be modified.  I have read the above information and consent to the administration of botulism toxin.    BOTOX PROCEDURE NOTE FOR MIGRAINE HEADACHE  Contraindications and precautions discussed with patient(above). Aseptic procedure was observed and patient tolerated procedure. Procedure performed by Shawnie Dapper, FNP-C.   The condition has existed for more than 6 months, and pt does not have a diagnosis of ALS, Myasthenia Gravis or Lambert-Eaton Syndrome.  Risks and benefits of injections discussed and pt agrees to proceed with the procedure.  Written consent obtained  These injections are medically necessary. Pt  receives good benefits from these injections. These injections do not cause sedations or hallucinations which the oral therapies may cause.   Description of procedure:  The patient was placed in a sitting position. The  standard protocol was used for Botox as follows, with 5 units of Botox injected at each site:  -Procerus muscle, midline injection  -Corrugator muscle, bilateral  injection  -Frontalis muscle, bilateral injection, with 2 sites each side, medial injection was performed in the upper one third of the frontalis muscle, in the region vertical from the medial inferior edge of the superior orbital rim. The lateral injection was again in the upper one third of the forehead vertically above the lateral limbus of the cornea, 1.5 cm lateral to the medial injection site.  -Temporalis muscle injection, 4 sites, bilaterally. The first injection was 3 cm above the tragus of the ear, second injection site was 1.5 cm to 3 cm up from the first injection site in line with the tragus of the ear. The third injection site was 1.5-3 cm forward between the first 2 injection sites. The fourth injection site was 1.5 cm posterior to the second injection site. 5th site laterally in the temporalis  muscleat the level of the outer canthus.  -Occipitalis muscle injection, 3 sites, bilaterally. The first injection was done one half way between the occipital protuberance and the tip of the mastoid process behind the ear. The second injection site was done lateral and superior to the first, 1 fingerbreadth from the first injection. The third injection site was 1 fingerbreadth superiorly and medially from the first injection site.  -Cervical paraspinal muscle injection, 2 sites, bilaterally. The first injection site was 1 cm from the midline of the cervical spine, 3 cm inferior to the lower border of the occipital protuberance. The second injection site was 1.5 cm superiorly and laterally to the first injection site.  -Trapezius muscle injection was performed at 3 sites, bilaterally. The first injection site was in the upper trapezius muscle halfway between the inflection point of the neck, and the acromion. The second injection site was one half way between the acromion and the first injection site. The third injection was done between the first injection site and the inflection point of the  neck.   Will return for repeat injection in 3 months.   A total of 200 units of Botox was prepared, 155 units of Botox was injected as documented above, any Botox not injected was wasted. The patient tolerated the procedure well, there were no complications of the above procedure.

## 2023-03-29 ENCOUNTER — Ambulatory Visit: Payer: Medicare Other | Admitting: Adult Health

## 2023-04-02 ENCOUNTER — Ambulatory Visit: Payer: Medicare Other | Admitting: Family Medicine

## 2023-04-02 DIAGNOSIS — G43719 Chronic migraine without aura, intractable, without status migrainosus: Secondary | ICD-10-CM

## 2023-04-09 NOTE — Progress Notes (Signed)
 04/11/23 ALL: Sonia Bell returns for Botox . Sees Jessica,NP. Last procedure 01/03/2023. She reports doing well. She usually have about 3 headache days a month. Sumatriptan  works well for abortive therapy.   07/19/2022 ALL: Sonia Bell returns for second Botox  procedure with our office, has been getting Botox  with [facility]. Last procedure with Harlene 04/2022. She reports 2-3 migraines per month. Sumatriptan  works well.   04/24/2022 JM: Patient is being seen for initial Botox  injection in this office.  Was previously receiving Botox  injection from [facility], last injection 10/2021.  Has been experiencing increased migraine headaches due to duration between Botox  injections, currently experiencing about 2-3 migraines per week (previously 4 migraines per month). Use of sumatriptan  with benefit.  Tolerated procedure well today.  Will return in 3 months for repeat injection.   Consent Form Botulism Toxin Injection For Chronic Migraine    Reviewed orally with patient, additionally signature is on file:  Botulism toxin has been approved by the Federal drug administration for treatment of chronic migraine. Botulism toxin does not cure chronic migraine and it may not be effective in some patients.  The administration of botulism toxin is accomplished by injecting a small amount of toxin into the muscles of the neck and head. Dosage must be titrated for each individual. Any benefits resulting from botulism toxin tend to wear off after 3 months with a repeat injection required if benefit is to be maintained. Injections are usually done every 3-4 months with maximum effect peak achieved by about 2 or 3 weeks. Botulism toxin is expensive and you should be sure of what costs you will incur resulting from the injection.  The side effects of botulism toxin use for chronic migraine may include:   -Transient, and usually mild, facial weakness with facial injections  -Transient, and usually mild, head or neck  weakness with head/neck injections  -Reduction or loss of forehead facial animation due to forehead muscle weakness  -Eyelid drooping  -Dry eye  -Pain at the site of injection or bruising at the site of injection  -Double vision  -Potential unknown long term risks   Contraindications: You should not have Botox  if you are pregnant, nursing, allergic to albumin, have an infection, skin condition, or muscle weakness at the site of the injection, or have myasthenia gravis, Lambert-Eaton syndrome, or ALS.  It is also possible that as with any injection, there may be an allergic reaction or no effect from the medication. Reduced effectiveness after repeated injections is sometimes seen and rarely infection at the injection site may occur. All care will be taken to prevent these side effects. If therapy is given over a long time, atrophy and wasting in the muscle injected may occur. Occasionally the patient's become refractory to treatment because they develop antibodies to the toxin. In this event, therapy needs to be modified.  I have read the above information and consent to the administration of botulism toxin.    BOTOX  PROCEDURE NOTE FOR MIGRAINE HEADACHE  Contraindications and precautions discussed with patient(above). Aseptic procedure was observed and patient tolerated procedure. Procedure performed by Greig Forbes, FNP-C.   The condition has existed for more than 6 months, and pt does not have a diagnosis of ALS, Myasthenia Gravis or Lambert-Eaton Syndrome.  Risks and benefits of injections discussed and pt agrees to proceed with the procedure.  Written consent obtained  These injections are medically necessary. Pt  receives good benefits from these injections. These injections do not cause sedations or hallucinations which the  oral therapies may cause.   Description of procedure:  The patient was placed in a sitting position. The standard protocol was used for Botox  as follows, with 5  units of Botox  injected at each site:  -Procerus muscle, midline injection  -Corrugator muscle, bilateral injection  -Frontalis muscle, bilateral injection, with 2 sites each side, medial injection was performed in the upper one third of the frontalis muscle, in the region vertical from the medial inferior edge of the superior orbital rim. The lateral injection was again in the upper one third of the forehead vertically above the lateral limbus of the cornea, 1.5 cm lateral to the medial injection site.  -Temporalis muscle injection, 4 sites, bilaterally. The first injection was 3 cm above the tragus of the ear, second injection site was 1.5 cm to 3 cm up from the first injection site in line with the tragus of the ear. The third injection site was 1.5-3 cm forward between the first 2 injection sites. The fourth injection site was 1.5 cm posterior to the second injection site. 5th site laterally in the temporalis  muscleat the level of the outer canthus.  -Occipitalis muscle injection, 3 sites, bilaterally. The first injection was done one half way between the occipital protuberance and the tip of the mastoid process behind the ear. The second injection site was done lateral and superior to the first, 1 fingerbreadth from the first injection. The third injection site was 1 fingerbreadth superiorly and medially from the first injection site.  -Cervical paraspinal muscle injection, 2 sites, bilaterally. The first injection site was 1 cm from the midline of the cervical spine, 3 cm inferior to the lower border of the occipital protuberance. The second injection site was 1.5 cm superiorly and laterally to the first injection site.  -Trapezius muscle injection was performed at 3 sites, bilaterally. The first injection site was in the upper trapezius muscle halfway between the inflection point of the neck, and the acromion. The second injection site was one half way between the acromion and the first  injection site. The third injection was done between the first injection site and the inflection point of the neck.   Will return for repeat injection in 3 months.   A total of 200 units of Botox  was prepared, 155 units of Botox  was injected as documented above, any Botox  not injected was wasted. The patient tolerated the procedure well, there were no complications of the above procedure.

## 2023-04-11 ENCOUNTER — Ambulatory Visit (INDEPENDENT_AMBULATORY_CARE_PROVIDER_SITE_OTHER): Payer: Medicare Other | Admitting: Family Medicine

## 2023-04-11 VITALS — BP 121/65 | HR 75

## 2023-04-11 DIAGNOSIS — G43719 Chronic migraine without aura, intractable, without status migrainosus: Secondary | ICD-10-CM | POA: Diagnosis not present

## 2023-04-11 MED ORDER — ONABOTULINUMTOXINA 200 UNITS IJ SOLR
155.0000 [IU] | Freq: Once | INTRAMUSCULAR | Status: AC
  Administered 2023-04-11: 155 [IU] via INTRAMUSCULAR

## 2023-04-11 NOTE — Progress Notes (Signed)
 Botox -200U x 1vial Lot: [id number] Expiration: 06/2025 NDC: 1829-9371-69  Bacteriostatic 0.9% Sodium Chloride- 4mL total [id number] Expiration:01/05/2024 NDC: 7510-2585-27  Witnessed by April Judiann Celia,RN

## 2023-07-05 IMAGING — CT CT CHEST W/O CM
2 of 4 series · 15 of 36 positions shown, 18 images · non-contrast
Comparison: Chest radiograph 04/26/2020, most remote chest CT
04/05/2018

CLINICAL DATA: Solitary pulmonary nodule.  Follow-up lung nodule.



[Series 2: thorax · axial · 0.71mm/px · z∈[-354,-74]mm · 12 of 164 slices shown, 15 images]
[im 12/164  mediastinal]
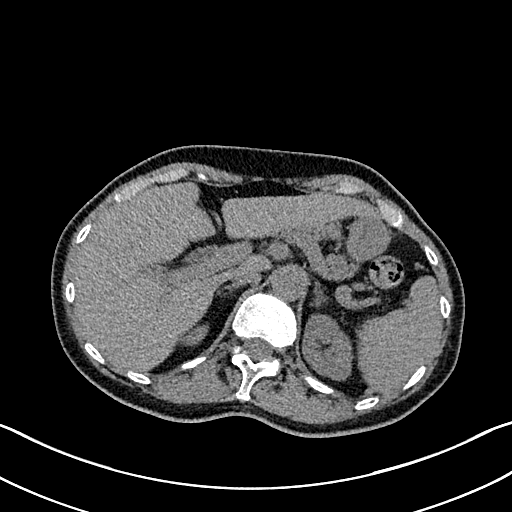
[im 12/164  lung]
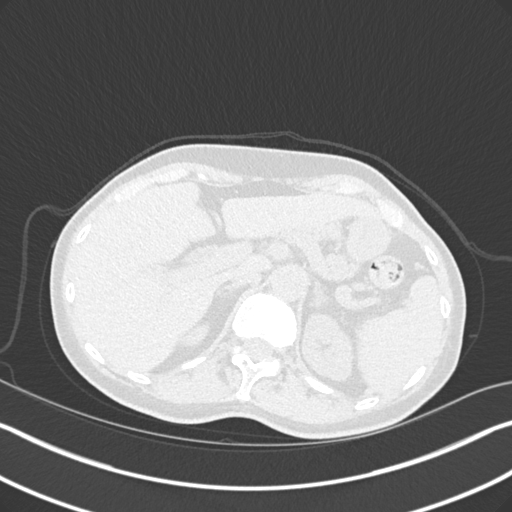
[im 24/164  lung]
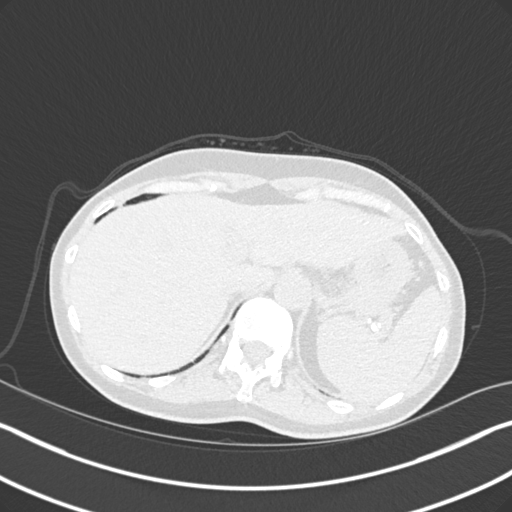
[im 35/164  lung]
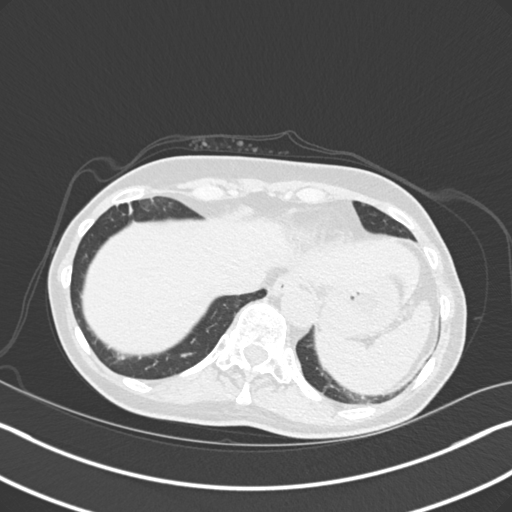
[im 47/164  lung]
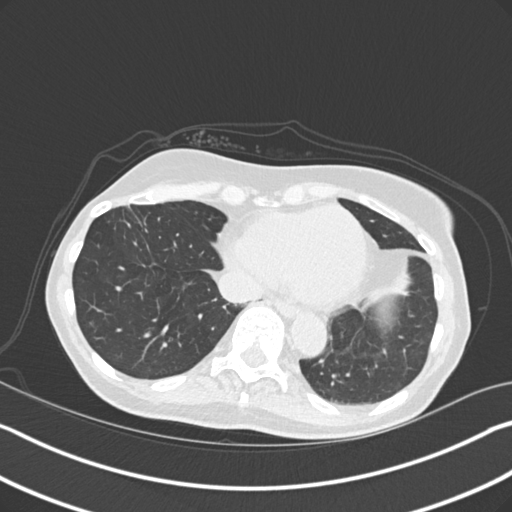
[im 59/164  mediastinal]
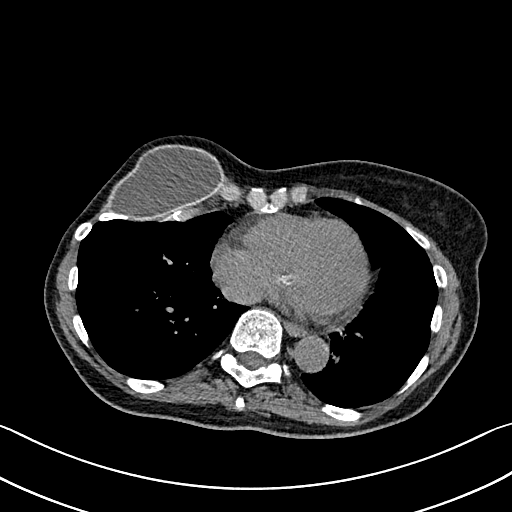
[im 59/164  lung]
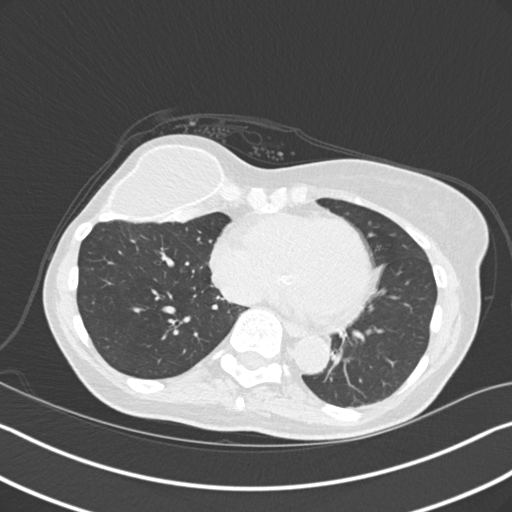
[im 70/164  lung]
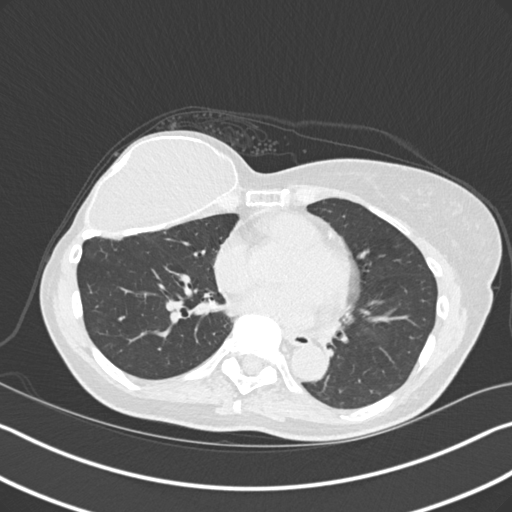
[im 94/164  lung]
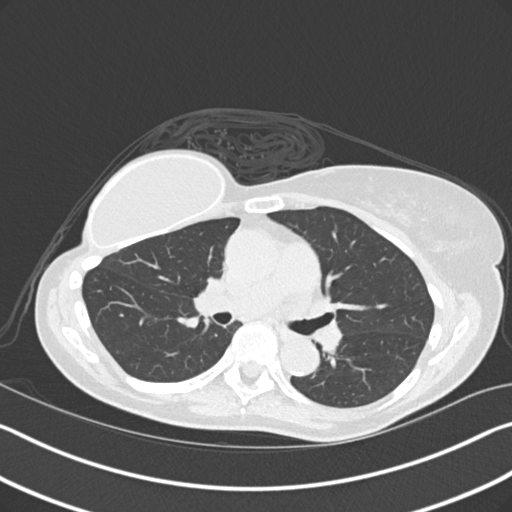
[im 105/164  lung]
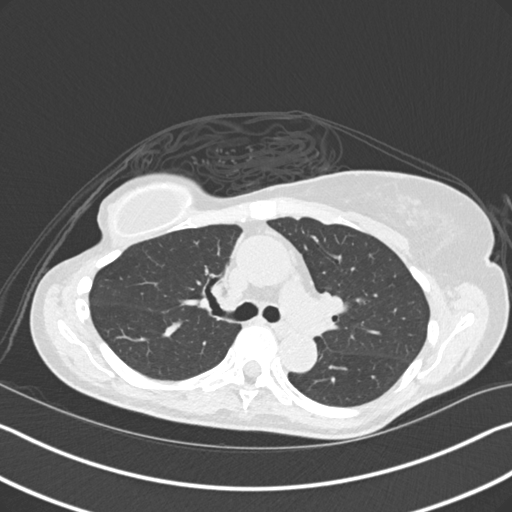
[im 117/164  mediastinal]
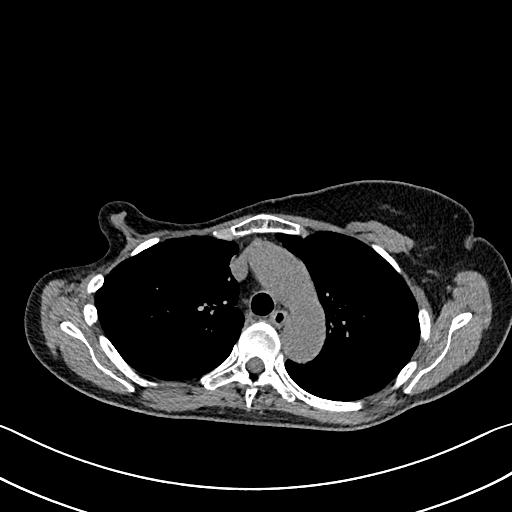
[im 117/164  lung]
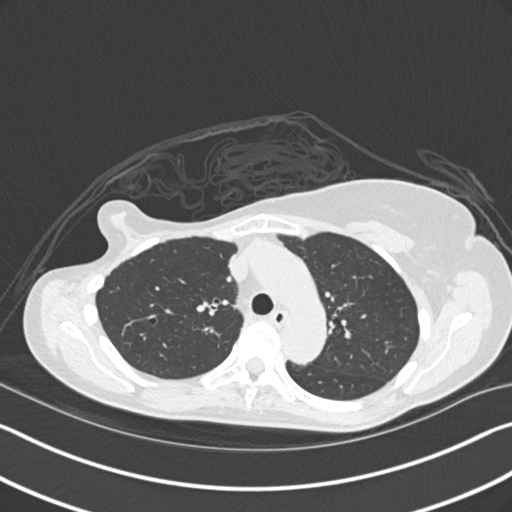
[im 129/164  lung]
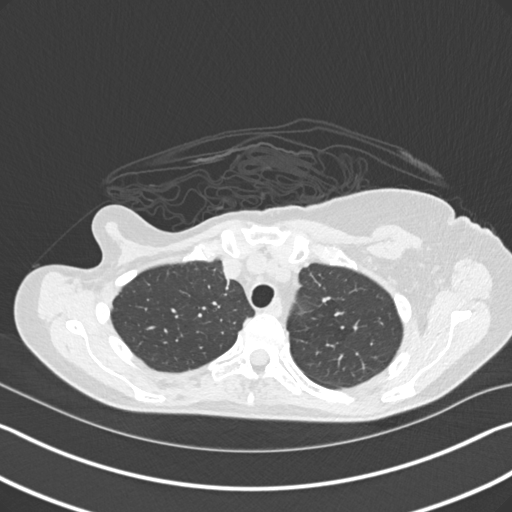
[im 140/164  lung]
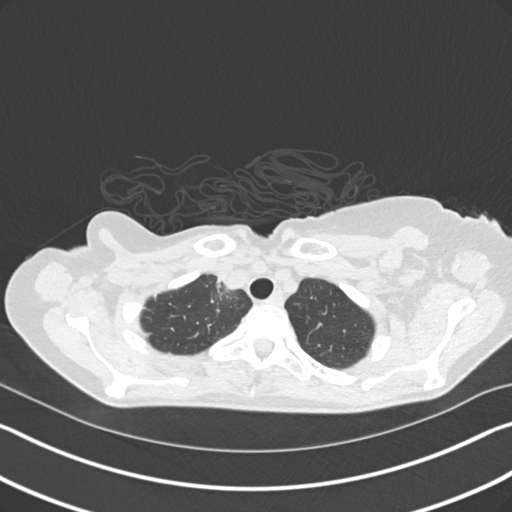
[im 152/164  lung]
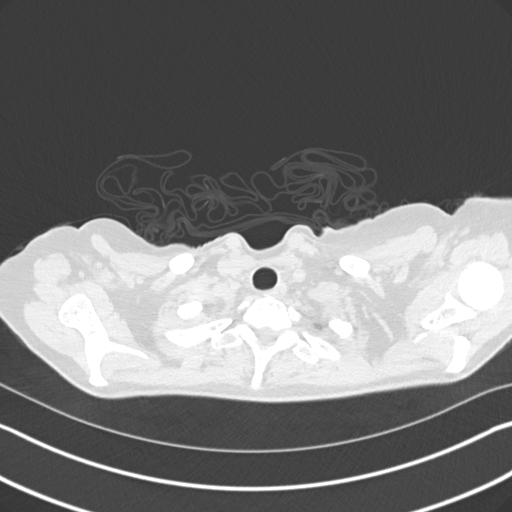

[Series 5: coronal · coronal · 0.67mm/px · 3 of 104 slices shown]
[im 21/104  lung]
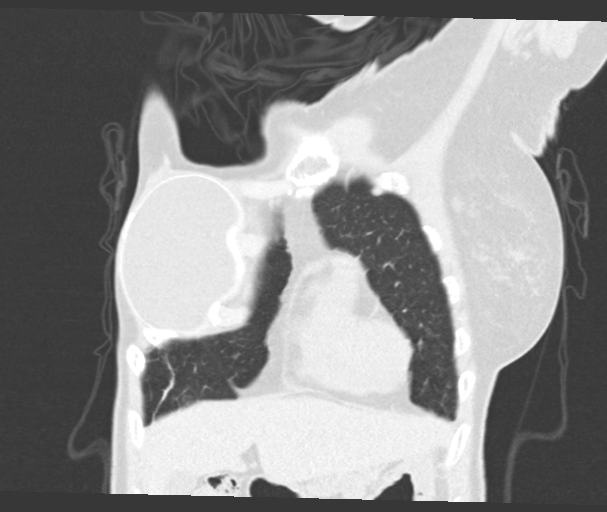
[im 42/104  lung]
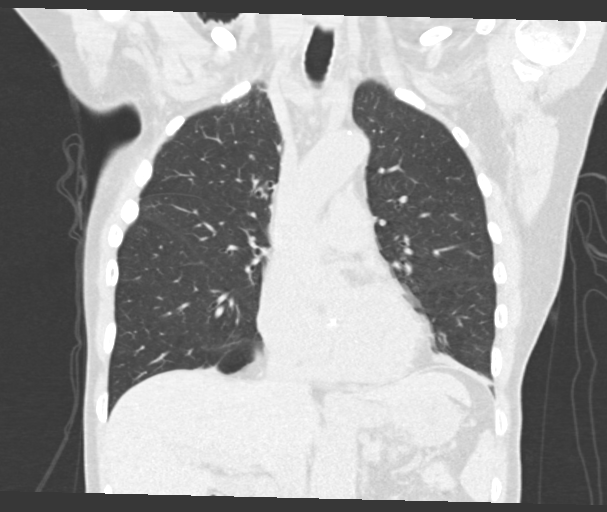
[im 62/104  lung]
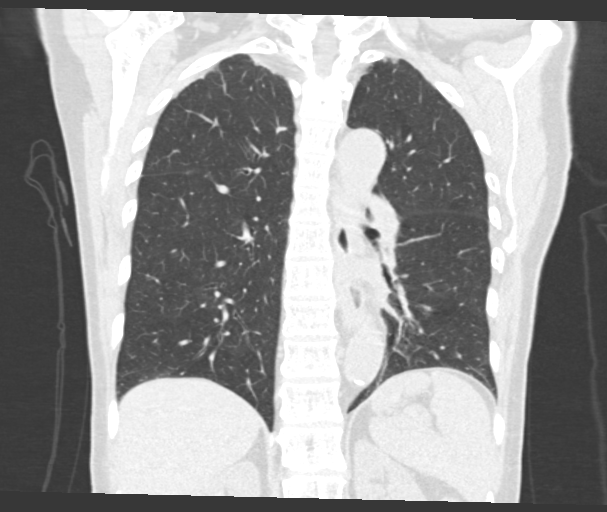

[15 of 36 positions shown; findings below may reference images not displayed]

FINDINGS: Cardiovascular: Mild aortic atherosclerosis. Aortic tortuosity. The
heart is normal in size. Occasional coronary artery calcifications.
No pericardial effusion.

Mediastinum/Nodes: No mediastinal adenopathy. Limited assessment for
hilar adenopathy on this unenhanced exam. There are no enlarged
axillary lymph nodes. No esophageal wall thickening.

Lungs/Pleura: Chronic post radiation changes at the right lung apex.
7 mm nodule in the medial right upper lobe, series 7, image 40,
unchanged from most recent prior exam as well from 1818 exam.
Calcified granuloma in the anterior right lower lobe series 7, image
59, stable from prior exams and benign. Tiny 2 mm right and left
lower lobe nodule series 7, image 103, stable from most recent prior
exam as well as 1818 exam. No new pulmonary nodule. No focal
airspace disease. No pleural effusion.

Upper Abdomen: No acute or unexpected findings.

Musculoskeletal: Status post right mastectomy with right breast
implant. No acute osseous findings or focal bone lesion. No chest
wall soft tissue abnormalities.
IMPRESSION: 1. Bilateral pulmonary nodules, largest measuring 7 mm at the right
lung apex. These nodules are unchanged from 1818 exam and considered
benign. No dedicated further nodule follow-up is needed.
2. Post right mastectomy. Chronic post radiation changes at the
right lung apex.

Aortic Atherosclerosis ([id number]-009.9).

## 2023-07-16 ENCOUNTER — Ambulatory Visit: Payer: Medicare Other | Admitting: Family Medicine

## 2023-07-16 ENCOUNTER — Ambulatory Visit (INDEPENDENT_AMBULATORY_CARE_PROVIDER_SITE_OTHER): Admitting: Adult Health

## 2023-07-16 ENCOUNTER — Ambulatory Visit: Payer: Medicare Other | Admitting: Adult Health

## 2023-07-16 VITALS — BP 141/84 | HR 74

## 2023-07-16 DIAGNOSIS — G43719 Chronic migraine without aura, intractable, without status migrainosus: Secondary | ICD-10-CM | POA: Diagnosis not present

## 2023-07-16 MED ORDER — SUMATRIPTAN SUCCINATE 100 MG PO TABS: ORAL_TABLET | ORAL | 11 refills | Status: AC

## 2023-07-16 MED ORDER — ONABOTULINUMTOXINA 200 UNITS IJ SOLR
155.0000 [IU] | Freq: Once | INTRAMUSCULAR | Status: AC
  Administered 2023-07-16: 155 [IU] via INTRAMUSCULAR

## 2023-07-16 NOTE — Progress Notes (Signed)
 Botox -200U x 1vial Lot: [id number] Expiration: 12/2025 NDC: 4782-9562-13   Bacteriostatic 0.9% Sodium Chloride- 4mL total [id number] Expiration:01/05/2024 NDC: 6295-2841-32   Witnessed by: Gabriel John B   BB  [phone]

## 2023-07-16 NOTE — Progress Notes (Signed)
 Update 07/16/2023 JM: Patient returns for repeat Botox .  Prior injection 04/11/2023 with Amy, NP.  She has had slight increase in migraine headaches over the past several months, about 4-5 per month, sumatriptan  continues to work well. She feels worsening migraines due to allergies and pollen. She wishes to continue with botox  treatment. Tolerated procedure well today. Will return in 3 months for repeat injection.         Consent Form Botulism Toxin Injection For Chronic Migraine    Reviewed orally with patient, additionally signature is on file:  Botulism toxin has been approved by the Federal drug administration for treatment of chronic migraine. Botulism toxin does not cure chronic migraine and it may not be effective in some patients.  The administration of botulism toxin is accomplished by injecting a small amount of toxin into the muscles of the neck and head. Dosage must be titrated for each individual. Any benefits resulting from botulism toxin tend to wear off after 3 months with a repeat injection required if benefit is to be maintained. Injections are usually done every 3-4 months with maximum effect peak achieved by about 2 or 3 weeks. Botulism toxin is expensive and you should be sure of what costs you will incur resulting from the injection.  The side effects of botulism toxin use for chronic migraine may include:   -Transient, and usually mild, facial weakness with facial injections  -Transient, and usually mild, head or neck weakness with head/neck injections  -Reduction or loss of forehead facial animation due to forehead muscle weakness  -Eyelid drooping  -Dry eye  -Pain at the site of injection or bruising at the site of injection  -Double vision  -Potential unknown long term risks   Contraindications: You should not have Botox  if you are pregnant, nursing, allergic to albumin, have an infection, skin condition, or muscle weakness at the site of the injection,  or have myasthenia gravis, Lambert-Eaton syndrome, or ALS.  It is also possible that as with any injection, there may be an allergic reaction or no effect from the medication. Reduced effectiveness after repeated injections is sometimes seen and rarely infection at the injection site may occur. All care will be taken to prevent these side effects. If therapy is given over a long time, atrophy and wasting in the muscle injected may occur. Occasionally the patient's become refractory to treatment because they develop antibodies to the toxin. In this event, therapy needs to be modified.  I have read the above information and consent to the administration of botulism toxin.    BOTOX  PROCEDURE NOTE FOR MIGRAINE HEADACHE  Contraindications and precautions discussed with patient(above). Aseptic procedure was observed and patient tolerated procedure. Procedure performed by Johny Nap, AGNP-BC.   The condition has existed for more than 6 months, and pt does not have a diagnosis of ALS, Myasthenia Gravis or Lambert-Eaton Syndrome.  Risks and benefits of injections discussed and pt agrees to proceed with the procedure.  Written consent obtained  These injections are medically necessary. Pt  receives good benefits from these injections. These injections do not cause sedations or hallucinations which the oral therapies may cause.   Description of procedure:  The patient was placed in a sitting position. The standard protocol was used for Botox  as follows, with 5 units of Botox  injected at each site:  -Procerus muscle, midline injection  -Corrugator muscle, bilateral injection  -Frontalis muscle, bilateral injection, with 2 sites each side, medial injection was performed in the  upper one third of the frontalis muscle, in the region vertical from the medial inferior edge of the superior orbital rim. The lateral injection was again in the upper one third of the forehead vertically above the lateral  limbus of the cornea, 1.5 cm lateral to the medial injection site.  -Temporalis muscle injection, 4 sites, bilaterally. The first injection was 3 cm above the tragus of the ear, second injection site was 1.5 cm to 3 cm up from the first injection site in line with the tragus of the ear. The third injection site was 1.5-3 cm forward between the first 2 injection sites. The fourth injection site was 1.5 cm posterior to the second injection site. 5th site laterally in the temporalis  muscleat the level of the outer canthus.  -Occipitalis muscle injection, 3 sites, bilaterally. The first injection was done one half way between the occipital protuberance and the tip of the mastoid process behind the ear. The second injection site was done lateral and superior to the first, 1 fingerbreadth from the first injection. The third injection site was 1 fingerbreadth superiorly and medially from the first injection site.  -Cervical paraspinal muscle injection, 2 sites, bilaterally. The first injection site was 1 cm from the midline of the cervical spine, 3 cm inferior to the lower border of the occipital protuberance. The second injection site was 1.5 cm superiorly and laterally to the first injection site.  -Trapezius muscle injection was performed at 3 sites, bilaterally. The first injection site was in the upper trapezius muscle halfway between the inflection point of the neck, and the acromion. The second injection site was one half way between the acromion and the first injection site. The third injection was done between the first injection site and the inflection point of the neck.    A total of 200 units of Botox  was prepared, 155 units of Botox  was injected as documented above, any Botox  not injected was wasted. The patient tolerated the procedure well, there were no complications of the above procedure.   Johny Nap, AGNP-BC  [facility] [address] [place],  [place] [address]  Phone [phone] Fax [phone] Note: This document was prepared with digital dictation and possible smart phrase technology. Any transcriptional errors that result from this process are unintentional.

## 2023-10-08 ENCOUNTER — Ambulatory Visit (INDEPENDENT_AMBULATORY_CARE_PROVIDER_SITE_OTHER): Admitting: Adult Health

## 2023-10-08 VITALS — BP 150/80

## 2023-10-08 DIAGNOSIS — G43719 Chronic migraine without aura, intractable, without status migrainosus: Secondary | ICD-10-CM

## 2023-10-08 MED ORDER — ONABOTULINUMTOXINA 200 UNITS IJ SOLR
155.0000 [IU] | Freq: Once | INTRAMUSCULAR | Status: AC
  Administered 2023-10-08: 155 [IU] via INTRAMUSCULAR

## 2023-10-08 NOTE — Progress Notes (Signed)
 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

## 2023-10-08 NOTE — Progress Notes (Signed)
 Update 10/08/2023 JM: Patient returns for repeat Botox .  Prior injection 07/16/2023.  Reports continued benefit with Botox  injections.  She has had some increase in migraine headaches which she attributes to warm weather and changes in barometric pressure.  Continues on sumatriptan  as needed.  Tolerated procedure well today.  Return in 3 months for repeat injection.        Consent Form Botulism Toxin Injection For Chronic Migraine    Reviewed orally with patient, additionally signature is on file:  Botulism toxin has been approved by the Federal drug administration for treatment of chronic migraine. Botulism toxin does not cure chronic migraine and it may not be effective in some patients.  The administration of botulism toxin is accomplished by injecting a small amount of toxin into the muscles of the neck and head. Dosage must be titrated for each individual. Any benefits resulting from botulism toxin tend to wear off after 3 months with a repeat injection required if benefit is to be maintained. Injections are usually done every 3-4 months with maximum effect peak achieved by about 2 or 3 weeks. Botulism toxin is expensive and you should be sure of what costs you will incur resulting from the injection.  The side effects of botulism toxin use for chronic migraine may include:   -Transient, and usually mild, facial weakness with facial injections  -Transient, and usually mild, head or neck weakness with head/neck injections  -Reduction or loss of forehead facial animation due to forehead muscle weakness  -Eyelid drooping  -Dry eye  -Pain at the site of injection or bruising at the site of injection  -Double vision  -Potential unknown long term risks   Contraindications: You should not have Botox  if you are pregnant, nursing, allergic to albumin, have an infection, skin condition, or muscle weakness at the site of the injection, or have myasthenia gravis, Lambert-Eaton syndrome,  or ALS.  It is also possible that as with any injection, there may be an allergic reaction or no effect from the medication. Reduced effectiveness after repeated injections is sometimes seen and rarely infection at the injection site may occur. All care will be taken to prevent these side effects. If therapy is given over a long time, atrophy and wasting in the muscle injected may occur. Occasionally the patient's become refractory to treatment because they develop antibodies to the toxin. In this event, therapy needs to be modified.  I have read the above information and consent to the administration of botulism toxin.    BOTOX  PROCEDURE NOTE FOR MIGRAINE HEADACHE  Contraindications and precautions discussed with patient(above). Aseptic procedure was observed and patient tolerated procedure. Procedure performed by Harlene Bogaert, AGNP-BC.   The condition has existed for more than 6 months, and pt does not have a diagnosis of ALS, Myasthenia Gravis or Lambert-Eaton Syndrome.  Risks and benefits of injections discussed and pt agrees to proceed with the procedure.  Written consent obtained  These injections are medically necessary. Pt  receives good benefits from these injections. These injections do not cause sedations or hallucinations which the oral therapies may cause.   Description of procedure:  The patient was placed in a sitting position. The standard protocol was used for Botox  as follows, with 5 units of Botox  injected at each site:  -Procerus muscle, midline injection  -Corrugator muscle, bilateral injection  -Frontalis muscle, bilateral injection, with 2 sites each side, medial injection was performed in the upper one third of the frontalis muscle, in the  region vertical from the medial inferior edge of the superior orbital rim. The lateral injection was again in the upper one third of the forehead vertically above the lateral limbus of the cornea, 1.5 cm lateral to the medial  injection site.  -Temporalis muscle injection, 4 sites, bilaterally. The first injection was 3 cm above the tragus of the ear, second injection site was 1.5 cm to 3 cm up from the first injection site in line with the tragus of the ear. The third injection site was 1.5-3 cm forward between the first 2 injection sites. The fourth injection site was 1.5 cm posterior to the second injection site. 5th site laterally in the temporalis  muscleat the level of the outer canthus.  -Occipitalis muscle injection, 3 sites, bilaterally. The first injection was done one half way between the occipital protuberance and the tip of the mastoid process behind the ear. The second injection site was done lateral and superior to the first, 1 fingerbreadth from the first injection. The third injection site was 1 fingerbreadth superiorly and medially from the first injection site.  -Cervical paraspinal muscle injection, 2 sites, bilaterally. The first injection site was 1 cm from the midline of the cervical spine, 3 cm inferior to the lower border of the occipital protuberance. The second injection site was 1.5 cm superiorly and laterally to the first injection site.  -Trapezius muscle injection was performed at 3 sites, bilaterally. The first injection site was in the upper trapezius muscle halfway between the inflection point of the neck, and the acromion. The second injection site was one half way between the acromion and the first injection site. The third injection was done between the first injection site and the inflection point of the neck.    A total of 200 units of Botox  was prepared, 155 units of Botox  was injected as documented above, any Botox  not injected was wasted. The patient tolerated the procedure well, there were no complications of the above procedure.   Harlene Bogaert, AGNP-BC  [facility] [address] [place], [place] [address]  Phone [phone] Fax  [phone] Note: This document was prepared with digital dictation and possible smart phrase technology. Any transcriptional errors that result from this process are unintentional.

## 2023-10-23 ENCOUNTER — Other Ambulatory Visit: Payer: Self-pay | Admitting: Obstetrics and Gynecology

## 2023-10-23 DIAGNOSIS — N644 Mastodynia: Secondary | ICD-10-CM

## 2023-10-24 ENCOUNTER — Ambulatory Visit
Admission: RE | Admit: 2023-10-24 | Discharge: 2023-10-24 | Disposition: A | Discharge status: Home / Self Care | Source: Ambulatory Visit | Attending: Obstetrics and Gynecology | Admitting: Obstetrics and Gynecology

## 2023-10-24 DIAGNOSIS — N644 Mastodynia: Secondary | ICD-10-CM

## 2023-10-29 ENCOUNTER — Other Ambulatory Visit: Payer: Self-pay | Admitting: Obstetrics and Gynecology

## 2023-10-29 DIAGNOSIS — T8543XA Leakage of breast prosthesis and implant, initial encounter: Secondary | ICD-10-CM

## 2023-11-27 ENCOUNTER — Inpatient Hospital Stay: Admission: RE | Admit: 2023-11-27 | Source: Ambulatory Visit

## 2023-11-30 ENCOUNTER — Ambulatory Visit
Admission: RE | Admit: 2023-11-30 | Discharge: 2023-11-30 | Disposition: A | Discharge status: Home / Self Care | Source: Ambulatory Visit | Attending: Obstetrics and Gynecology | Admitting: Obstetrics and Gynecology

## 2023-11-30 DIAGNOSIS — T8543XA Leakage of breast prosthesis and implant, initial encounter: Secondary | ICD-10-CM

## 2023-11-30 MED ORDER — GADOPICLENOL 0.5 MMOL/ML IV SOLN
6.0000 mL | Freq: Once | INTRAVENOUS | Status: AC | PRN
  Administered 2023-11-30: 6 mL via INTRAVENOUS

## 2023-12-05 ENCOUNTER — Other Ambulatory Visit: Payer: Self-pay | Admitting: Obstetrics and Gynecology

## 2023-12-05 ENCOUNTER — Encounter: Payer: Self-pay | Admitting: Obstetrics and Gynecology

## 2023-12-05 DIAGNOSIS — T8543XA Leakage of breast prosthesis and implant, initial encounter: Secondary | ICD-10-CM

## 2023-12-11 ENCOUNTER — Ambulatory Visit
Admission: RE | Admit: 2023-12-11 | Discharge: 2023-12-11 | Disposition: A | Discharge status: Home / Self Care | Source: Ambulatory Visit | Attending: Obstetrics and Gynecology | Admitting: Obstetrics and Gynecology

## 2023-12-11 DIAGNOSIS — T8543XA Leakage of breast prosthesis and implant, initial encounter: Secondary | ICD-10-CM

## 2024-01-01 ENCOUNTER — Ambulatory Visit (INDEPENDENT_AMBULATORY_CARE_PROVIDER_SITE_OTHER): Admitting: Adult Health

## 2024-01-01 VITALS — BP 119/79 | HR 91

## 2024-01-01 DIAGNOSIS — G43719 Chronic migraine without aura, intractable, without status migrainosus: Secondary | ICD-10-CM

## 2024-01-01 MED ORDER — ONABOTULINUMTOXINA 200 UNITS IJ SOLR
155.0000 [IU] | Freq: Once | INTRAMUSCULAR | Status: AC
  Administered 2024-01-01: 155 [IU] via INTRAMUSCULAR

## 2024-01-01 NOTE — Progress Notes (Signed)
 Update 01/01/2024 JM: Patient returns for repeat Botox .  Prior injection 10/08/2023.  Reports continued benefit with Botox  injections.  She has had some increase in migraine headaches which she attributes to recent infection (silicon implant), has been on antibiotics. Prior to infection, migraines well controlled.  Continues on sumatriptan  as needed with benefit.  Tolerated procedure well today.  Return in 3 months for repeat injection.        Consent Form Botulism Toxin Injection For Chronic Migraine    Reviewed orally with patient, additionally signature is on file:  Botulism toxin has been approved by the Federal drug administration for treatment of chronic migraine. Botulism toxin does not cure chronic migraine and it may not be effective in some patients.  The administration of botulism toxin is accomplished by injecting a small amount of toxin into the muscles of the neck and head. Dosage must be titrated for each individual. Any benefits resulting from botulism toxin tend to wear off after 3 months with a repeat injection required if benefit is to be maintained. Injections are usually done every 3-4 months with maximum effect peak achieved by about 2 or 3 weeks. Botulism toxin is expensive and you should be sure of what costs you will incur resulting from the injection.  The side effects of botulism toxin use for chronic migraine may include:   -Transient, and usually mild, facial weakness with facial injections  -Transient, and usually mild, head or neck weakness with head/neck injections  -Reduction or loss of forehead facial animation due to forehead muscle weakness  -Eyelid drooping  -Dry eye  -Pain at the site of injection or bruising at the site of injection  -Double vision  -Potential unknown long term risks   Contraindications: You should not have Botox  if you are pregnant, nursing, allergic to albumin, have an infection, skin condition, or muscle weakness at the  site of the injection, or have myasthenia gravis, Lambert-Eaton syndrome, or ALS.  It is also possible that as with any injection, there may be an allergic reaction or no effect from the medication. Reduced effectiveness after repeated injections is sometimes seen and rarely infection at the injection site may occur. All care will be taken to prevent these side effects. If therapy is given over a long time, atrophy and wasting in the muscle injected may occur. Occasionally the patient's become refractory to treatment because they develop antibodies to the toxin. In this event, therapy needs to be modified.  I have read the above information and consent to the administration of botulism toxin.    BOTOX  PROCEDURE NOTE FOR MIGRAINE HEADACHE  Contraindications and precautions discussed with patient(above). Aseptic procedure was observed and patient tolerated procedure. Procedure performed by Harlene Bogaert, AGNP-BC.   The condition has existed for more than 6 months, and pt does not have a diagnosis of ALS, Myasthenia Gravis or Lambert-Eaton Syndrome.  Risks and benefits of injections discussed and pt agrees to proceed with the procedure.  Written consent obtained  These injections are medically necessary. Pt  receives good benefits from these injections. These injections do not cause sedations or hallucinations which the oral therapies may cause.   Description of procedure:  The patient was placed in a sitting position. The standard protocol was used for Botox  as follows, with 5 units of Botox  injected at each site:  -Procerus muscle, midline injection  -Corrugator muscle, bilateral injection  -Frontalis muscle, bilateral injection, with 2 sites each side, medial injection was performed in the  upper one third of the frontalis muscle, in the region vertical from the medial inferior edge of the superior orbital rim. The lateral injection was again in the upper one third of the forehead vertically  above the lateral limbus of the cornea, 1.5 cm lateral to the medial injection site.  -Temporalis muscle injection, 4 sites, bilaterally. The first injection was 3 cm above the tragus of the ear, second injection site was 1.5 cm to 3 cm up from the first injection site in line with the tragus of the ear. The third injection site was 1.5-3 cm forward between the first 2 injection sites. The fourth injection site was 1.5 cm posterior to the second injection site. 5th site laterally in the temporalis  muscleat the level of the outer canthus.  -Occipitalis muscle injection, 3 sites, bilaterally. The first injection was done one half way between the occipital protuberance and the tip of the mastoid process behind the ear. The second injection site was done lateral and superior to the first, 1 fingerbreadth from the first injection. The third injection site was 1 fingerbreadth superiorly and medially from the first injection site.  -Cervical paraspinal muscle injection, 2 sites, bilaterally. The first injection site was 1 cm from the midline of the cervical spine, 3 cm inferior to the lower border of the occipital protuberance. The second injection site was 1.5 cm superiorly and laterally to the first injection site.  -Trapezius muscle injection was performed at 3 sites, bilaterally. The first injection site was in the upper trapezius muscle halfway between the inflection point of the neck, and the acromion. The second injection site was one half way between the acromion and the first injection site. The third injection was done between the first injection site and the inflection point of the neck.    A total of 200 units of Botox  was prepared, 155 units of Botox  was injected as documented above, any Botox  not injected was wasted. The patient tolerated the procedure well, there were no complications of the above procedure.   Harlene Bogaert, AGNP-BC  [facility] [address] [place], [place] [address]  Phone [phone] Fax [phone] Note: This document was prepared with digital dictation and possible smart phrase technology. Any transcriptional errors that result from this process are unintentional.

## 2024-01-01 NOTE — Progress Notes (Signed)
 Botox - 200 units x 1 vial Lot: [id number] Expiration: 02/2026 NDC: 9976-6078-97   Bacteriostatic 0.9% Sodium Chloride- 4 mL  Lot: [id number] Expiration: 01/03/2025 NDC: 9590-8033-97   Dx: H56.280 B/B Witnessed by Rojean DEL

## 2024-02-12 ENCOUNTER — Other Ambulatory Visit: Payer: Self-pay | Admitting: Pulmonary Disease

## 2024-03-24 ENCOUNTER — Telehealth: Payer: Self-pay

## 2024-03-24 NOTE — Telephone Encounter (Signed)
 Prescription refill request from [facility],refilled was made 03/11/2024; called pt  to know whether RX was received. Patient has not been seen 2022, needs an apt.

## 2024-03-25 ENCOUNTER — Other Ambulatory Visit: Payer: Self-pay

## 2024-03-25 MED ORDER — ALBUTEROL SULFATE HFA 108 (90 BASE) MCG/ACT IN AERS: 2.0000 | INHALATION_SPRAY | Freq: Four times a day (QID) | RESPIRATORY_TRACT | 1 refills | Status: AC | PRN

## 2024-03-25 NOTE — Telephone Encounter (Signed)
 Copied from CRM #[id number]. Topic: Appointments - Appointment Scheduling >> Mar 24, 2024  4:51 PM Rilla B wrote: Patient received a message to schedule an appt. Patient has prescription request for albuterol  inhaler. Patient has seen Dr Neda in the past and request to see him.  Appt has been scheduled for 2/17

## 2024-03-27 ENCOUNTER — Ambulatory Visit: Admitting: Adult Health

## 2024-03-31 ENCOUNTER — Ambulatory Visit: Admitting: Adult Health

## 2024-04-01 ENCOUNTER — Telehealth: Payer: Self-pay | Admitting: Adult Health

## 2024-04-01 NOTE — Telephone Encounter (Signed)
 LVM asking pt to call me back to r/s Botox  from 1/26- office closed due to inclement weather.

## 2024-04-22 ENCOUNTER — Ambulatory Visit: Admitting: Pulmonary Disease
